# Patient Record
Sex: Male | Born: 1960 | Race: White | Hispanic: No | Smoking: Former smoker
Health system: Southern US, Community
[De-identification: ages and names within clinical notes are randomized; demographics above are authoritative.]

## PROBLEM LIST (undated history)

## (undated) DIAGNOSIS — F319 Bipolar disorder, unspecified: Secondary | ICD-10-CM

## (undated) HISTORY — PX: FEMUR SURGERY: SHX943

## (undated) HISTORY — DX: Bipolar disorder, unspecified: F31.9

## (undated) HISTORY — PX: TONSILLECTOMY: SUR1361

---

## 1988-02-03 DIAGNOSIS — F319 Bipolar disorder, unspecified: Secondary | ICD-10-CM

## 1988-02-03 HISTORY — DX: Bipolar disorder, unspecified: F31.9

## 2004-10-15 ENCOUNTER — Ambulatory Visit: Payer: Self-pay | Admitting: Internal Medicine

## 2004-10-30 ENCOUNTER — Ambulatory Visit: Payer: Self-pay | Admitting: Internal Medicine

## 2005-02-17 ENCOUNTER — Ambulatory Visit: Payer: Self-pay | Admitting: Internal Medicine

## 2005-04-22 ENCOUNTER — Ambulatory Visit: Payer: Self-pay | Admitting: Internal Medicine

## 2005-04-29 ENCOUNTER — Ambulatory Visit: Payer: Self-pay | Admitting: Internal Medicine

## 2005-05-13 ENCOUNTER — Emergency Department (HOSPITAL_COMMUNITY): Admission: EM | Admit: 2005-05-13 | Discharge: 2005-05-13 | Payer: Self-pay | Admitting: Emergency Medicine

## 2006-01-11 ENCOUNTER — Ambulatory Visit: Payer: Self-pay | Admitting: Internal Medicine

## 2006-03-22 ENCOUNTER — Ambulatory Visit: Payer: Self-pay | Admitting: Internal Medicine

## 2006-03-22 LAB — CONVERTED CEMR LAB
ALT: 24 units/L (ref 0–40)
AST: 24 units/L (ref 0–37)
Albumin: 3.6 g/dL (ref 3.5–5.2)
Alkaline Phosphatase: 60 units/L (ref 39–117)
BUN: 10 mg/dL (ref 6–23)
Basophils Absolute: 0.1 10*3/uL (ref 0.0–0.1)
Basophils Relative: 1.8 % — ABNORMAL HIGH (ref 0.0–1.0)
Bilirubin, Direct: 0.1 mg/dL (ref 0.0–0.3)
CO2: 29 meq/L (ref 19–32)
Calcium: 9 mg/dL (ref 8.4–10.5)
Chloride: 104 meq/L (ref 96–112)
Cholesterol: 188 mg/dL (ref 0–200)
Creatinine, Ser: 1 mg/dL (ref 0.4–1.5)
Eosinophils Absolute: 0.2 10*3/uL (ref 0.0–0.6)
Eosinophils Relative: 4.8 % (ref 0.0–5.0)
GFR calc Af Amer: 104 mL/min
GFR calc non Af Amer: 86 mL/min
Glucose, Bld: 86 mg/dL (ref 70–99)
HCT: 40.4 % (ref 39.0–52.0)
HDL: 41.9 mg/dL (ref >39.0)
Hemoglobin: 14.4 g/dL (ref 13.0–17.0)
LDL Cholesterol: 112 mg/dL — ABNORMAL HIGH (ref 0–99)
Lymphocytes Relative: 39.9 % (ref 12.0–46.0)
MCHC: 35.6 g/dL (ref 30.0–36.0)
MCV: 95.3 fL (ref 78.0–100.0)
Monocytes Absolute: 0.5 10*3/uL (ref 0.2–0.7)
Monocytes Relative: 11 % (ref 3.0–11.0)
Neutro Abs: 2 10*3/uL (ref 1.4–7.7)
Neutrophils Relative %: 42.5 % — ABNORMAL LOW (ref 43.0–77.0)
Platelets: 187 10*3/uL (ref 150–400)
Potassium: 3.8 meq/L (ref 3.5–5.1)
RBC: 4.24 M/uL (ref 4.22–5.81)
RDW: 12.1 % (ref 11.5–14.6)
Sodium: 141 meq/L (ref 135–145)
TSH: 4.62 microintl units/mL (ref 0.35–5.50)
Total Bilirubin: 0.9 mg/dL (ref 0.3–1.2)
Total CHOL/HDL Ratio: 4.5
Total Protein: 5.9 g/dL — ABNORMAL LOW (ref 6.0–8.3)
Triglycerides: 170 mg/dL — ABNORMAL HIGH (ref 0–149)
VLDL: 34 mg/dL (ref 0–40)
WBC: 4.6 10*3/uL (ref 4.5–10.5)

## 2006-03-29 ENCOUNTER — Ambulatory Visit: Payer: Self-pay | Admitting: Internal Medicine

## 2007-11-16 ENCOUNTER — Telehealth: Payer: Self-pay | Admitting: Internal Medicine

## 2007-12-21 ENCOUNTER — Ambulatory Visit: Payer: Self-pay | Admitting: Internal Medicine

## 2007-12-21 LAB — CONVERTED CEMR LAB
ALT: 26 units/L (ref 0–53)
Basophils Absolute: 0 10*3/uL (ref 0.0–0.1)
Bilirubin Urine: NEGATIVE
Bilirubin, Direct: 0.1 mg/dL (ref 0.0–0.3)
Blood in Urine, dipstick: NEGATIVE
CO2: 30 meq/L (ref 19–32)
Calcium: 9.2 mg/dL (ref 8.4–10.5)
Cholesterol: 175 mg/dL (ref 0–200)
Glucose, Urine, Semiquant: NEGATIVE
HDL: 36.3 mg/dL — ABNORMAL LOW (ref >39.0)
Lymphocytes Relative: 44.2 % (ref 12.0–46.0)
MCHC: 35.5 g/dL (ref 30.0–36.0)
Neutro Abs: 2.1 10*3/uL (ref 1.4–7.7)
Neutrophils Relative %: 39.9 % — ABNORMAL LOW (ref 43.0–77.0)
Protein, U semiquant: NEGATIVE
RDW: 12.1 % (ref 11.5–14.6)
Sodium: 140 meq/L (ref 135–145)
TSH: 4.55 microintl units/mL (ref 0.35–5.50)
Total Bilirubin: 0.7 mg/dL (ref 0.3–1.2)
Triglycerides: 222 mg/dL (ref 0–149)
VLDL: 44 mg/dL — ABNORMAL HIGH (ref 0–40)
pH: 7

## 2008-01-06 ENCOUNTER — Telehealth: Payer: Self-pay | Admitting: Internal Medicine

## 2010-09-10 ENCOUNTER — Emergency Department (HOSPITAL_COMMUNITY): Payer: Managed Care, Other (non HMO)

## 2010-09-10 ENCOUNTER — Inpatient Hospital Stay (HOSPITAL_COMMUNITY)
Admission: EM | Admit: 2010-09-10 | Discharge: 2010-09-12 | DRG: 603 | Disposition: A | Discharge status: Home / Self Care | Payer: Managed Care, Other (non HMO) | Attending: Orthopedic Surgery | Admitting: Orthopedic Surgery

## 2010-09-10 DIAGNOSIS — L03019 Cellulitis of unspecified finger: Secondary | ICD-10-CM | POA: Diagnosis present

## 2010-09-10 DIAGNOSIS — W268XXA Contact with other sharp object(s), not elsewhere classified, initial encounter: Secondary | ICD-10-CM | POA: Diagnosis present

## 2010-09-10 DIAGNOSIS — L03119 Cellulitis of unspecified part of limb: Principal | ICD-10-CM | POA: Diagnosis present

## 2010-09-10 DIAGNOSIS — F319 Bipolar disorder, unspecified: Secondary | ICD-10-CM | POA: Diagnosis present

## 2010-09-10 DIAGNOSIS — S61209A Unspecified open wound of unspecified finger without damage to nail, initial encounter: Secondary | ICD-10-CM | POA: Diagnosis present

## 2010-09-10 DIAGNOSIS — L02519 Cutaneous abscess of unspecified hand: Principal | ICD-10-CM | POA: Diagnosis present

## 2010-09-10 LAB — POCT I-STAT, CHEM 8
Chloride: 98 mEq/L (ref 96–112)
Glucose, Bld: 89 mg/dL (ref 70–99)
HCT: 42 % (ref 39.0–52.0)
Potassium: 4.1 mEq/L (ref 3.5–5.1)
Sodium: 136 mEq/L (ref 135–145)

## 2010-09-10 LAB — CBC
MCH: 32.2 pg (ref 26.0–34.0)
MCHC: 35.6 g/dL (ref 30.0–36.0)
MCV: 90.3 fL (ref 78.0–100.0)
Platelets: 161 10*3/uL (ref 150–400)
RBC: 4.32 MIL/uL (ref 4.22–5.81)

## 2010-09-10 LAB — DIFFERENTIAL
Lymphocytes Relative: 10 % — ABNORMAL LOW (ref 12–46)
Lymphs Abs: 1.7 10*3/uL (ref 0.7–4.0)
Neutro Abs: 13.6 10*3/uL — ABNORMAL HIGH (ref 1.7–7.7)
Neutrophils Relative %: 79 % — ABNORMAL HIGH (ref 43–77)

## 2010-09-11 LAB — BASIC METABOLIC PANEL
BUN: 8 mg/dL (ref 6–23)
Creatinine, Ser: 0.75 mg/dL (ref 0.50–1.35)
GFR calc Af Amer: >60 mL/min (ref >60)
GFR calc non Af Amer: >60 mL/min (ref >60)

## 2010-09-11 LAB — CBC
HCT: 33.7 % — ABNORMAL LOW (ref 39.0–52.0)
MCHC: 35.6 g/dL (ref 30.0–36.0)
MCV: 89.6 fL (ref 78.0–100.0)
RDW: 12.4 % (ref 11.5–15.5)

## 2010-09-12 LAB — CBC
HCT: 34.6 % — ABNORMAL LOW (ref 39.0–52.0)
Hemoglobin: 12.7 g/dL — ABNORMAL LOW (ref 13.0–17.0)
MCV: 89.4 fL (ref 78.0–100.0)
RBC: 3.87 MIL/uL — ABNORMAL LOW (ref 4.22–5.81)
WBC: 8.6 10*3/uL (ref 4.0–10.5)

## 2010-09-12 LAB — BASIC METABOLIC PANEL
BUN: 10 mg/dL (ref 6–23)
CO2: 27 mEq/L (ref 19–32)
Chloride: 103 mEq/L (ref 96–112)
Creatinine, Ser: 0.79 mg/dL (ref 0.50–1.35)
Glucose, Bld: 103 mg/dL — ABNORMAL HIGH (ref 70–99)

## 2010-09-17 LAB — CULTURE, BLOOD (ROUTINE X 2)
Culture  Setup Time: 201208090428
Culture: NO GROWTH

## 2010-09-19 NOTE — Discharge Summary (Signed)
  Brendan [name], Brendan [name] NO.:  [id number]  MEDICAL RECORD NO.:  [id number]  LOCATION:  1601                         FACILITY:  [facility]  PHYSICIAN:  Nadara Mustard, MD     DATE OF BIRTH:  1960-06-23  DATE OF ADMISSION:  09/10/2010 DATE OF DISCHARGE:  09/12/2010                              DISCHARGE SUMMARY   FINAL DIAGNOSIS:  Abscess and infection, right hand, and right long finger on the dorsum.  SURGICAL PROCEDURES:  Irrigation and debridement of right hand, right long finger.  IV ANTIBIOTICS:  Doxycycline.  Discharged to home in stable condition.  Continue wound care with soaks 20 minutes b.i.d.  Prescription for doxycycline 100 mg p.o. b.i.d. Percocet one to two p.o. q.4 h. p.r.n. for pain, mupirocin ointment dressing changes twice a day.  Follow up in the office next week.     Nadara Mustard, MD     MVD/MEDQ  D:  09/12/2010  T:  09/12/2010  Job:  [id number]  Electronically Signed by Aldean Baker MD on 09/19/2010 06:18:15 AM

## 2010-09-19 NOTE — Op Note (Signed)
  NAME:  Brendan [name], Brendan [name] NO.:  [id number]  MEDICAL RECORD NO.:  [id number]  LOCATION:  WLED                         FACILITY:  [facility]  PHYSICIAN:  Nadara Mustard, MD     DATE OF BIRTH:  05-24-1960  DATE OF PROCEDURE:  09/10/2010 DATE OF DISCHARGE:                              OPERATIVE REPORT   HISTORY OF PRESENT ILLNESS:  The patient is a 51 year old gentleman who states he was fishing on Monday and got a fish hook caught on the dorsum of the proximal phalanx of the right long finger.  The hook stayed in his finger for several hours while he continued to fish.  The patient then removed the hook and went to the medical doctor on Tuesday and he was started on Augmentin. The patient returned to the primary care physician today with red streaking going up his arm with pain, swelling and redness and drainage from the dorsum of the long finger.  The patient was then referred to the emergency room and presents at this time for initial evaluation.  PAST MEDICAL HISTORY:  Positive for history of bipolar disease.  He is on three medications for this.  No other allergies, no other medications.  Tetanus booster provided in the emergency room.  On physical examination he has swelling and redness over the dorsum of the long finger.  Dorsum of the hand with streaking going up the arm.  There is adenopathy.  His hand has pain with range of motion, but no tenderness to palpation over the flexor tendons.  Assessment is dorsal abscess of the right hand and right long finger.  PLAN:  After informed consent and sterile prepping, the right hand and long finger were locally anesthetized with 20 mL of 2% lidocaine plain. An extensile incision was made in a zigzag fashion over the dorsum of the hand from the PIP joint to the mid dorsum of the hand.  There was a small abscess, which was debrided.  The wound was irrigated with normal saline.  This was decompressed.  There was no  purulence extending upward down the extensor tendon.  There was no involvement of the flexor sheath.  The wound was irrigated with normal saline.  The wound was closed loosely with iodoform gauze packed proximally and distally.  The patient received doxycycline IV and vancomycin IV in the emergency room. He was then admitted and placed on IV doxycycline. We will follow this expectantly.  Discussed that the patient may be at risk for need for additional surgery.  Discussed we will follow this closely.  We will reevaluate in the morning after the wound was closed with 3-0 nylon loosely with a Iodoform packing, Adaptic, 4x4s, Kerlix and Coban.  The patient will be placed in a blue arm elevator and admitted to the orthopedic floor.     Nadara Mustard, MD     MVD/MEDQ  D:  09/11/2010  T:  09/11/2010  Job:  [id number]  Electronically Signed by Aldean Baker MD on 09/19/2010 06:18:44 AM

## 2013-04-08 ENCOUNTER — Emergency Department (HOSPITAL_COMMUNITY): Payer: BC Managed Care – PPO

## 2013-04-08 ENCOUNTER — Encounter (HOSPITAL_COMMUNITY): Payer: Self-pay | Admitting: Emergency Medicine

## 2013-04-08 ENCOUNTER — Emergency Department (HOSPITAL_COMMUNITY)
Admission: EM | Admit: 2013-04-08 | Discharge: 2013-04-08 | Disposition: A | Discharge status: Home / Self Care | Payer: BC Managed Care – PPO | Attending: Emergency Medicine | Admitting: Emergency Medicine

## 2013-04-08 DIAGNOSIS — Z8659 Personal history of other mental and behavioral disorders: Secondary | ICD-10-CM | POA: Insufficient documentation

## 2013-04-08 DIAGNOSIS — S91119A Laceration without foreign body of unspecified toe without damage to nail, initial encounter: Secondary | ICD-10-CM

## 2013-04-08 DIAGNOSIS — Y92009 Unspecified place in unspecified non-institutional (private) residence as the place of occurrence of the external cause: Secondary | ICD-10-CM | POA: Insufficient documentation

## 2013-04-08 DIAGNOSIS — S91109A Unspecified open wound of unspecified toe(s) without damage to nail, initial encounter: Secondary | ICD-10-CM | POA: Insufficient documentation

## 2013-04-08 DIAGNOSIS — W2209XA Striking against other stationary object, initial encounter: Secondary | ICD-10-CM | POA: Insufficient documentation

## 2013-04-08 DIAGNOSIS — Y9301 Activity, walking, marching and hiking: Secondary | ICD-10-CM | POA: Insufficient documentation

## 2013-04-08 HISTORY — DX: Bipolar disorder, unspecified: F31.9

## 2013-04-08 NOTE — ED Notes (Signed)
Pt ran foot over a piece of glass on the floor. Moderate bleeding. Cut noted to inner 3 toe digit on R foot.

## 2013-04-08 NOTE — ED Provider Notes (Signed)
Medical screening examination/treatment/procedure(s) were performed by non-physician practitioner and as supervising physician I was immediately available for consultation/collaboration.   EKG Interpretation None        Layla MawKristen N Jeiry Birnbaum, DO 04/08/13 2302

## 2013-04-08 NOTE — Discharge Instructions (Signed)
Laceration Care, Adult °A laceration is a cut or lesion that goes through all layers of the skin and into the tissue just beneath the skin. °TREATMENT  °Some lacerations may not require closure. Some lacerations may not be able to be closed due to an increased risk of infection. It is important to see your caregiver as soon as possible after an injury to minimize the risk of infection and maximize the opportunity for successful closure. °If closure is appropriate, pain medicines may be given, if needed. The wound will be cleaned to help prevent infection. Your caregiver will use stitches (sutures), staples, wound glue (adhesive), or skin adhesive strips to repair the laceration. These tools bring the skin edges together to allow for faster healing and a better cosmetic outcome. However, all wounds will heal with a scar. Once the wound has healed, scarring can be minimized by covering the wound with sunscreen during the day for 1 full year. °HOME CARE INSTRUCTIONS  °For sutures or staples: °· Keep the wound clean and dry. °· If you were given a bandage (dressing), you should change it at least once a day. Also, change the dressing if it becomes wet or dirty, or as directed by your caregiver. °· Wash the wound with soap and water 2 times a day. Rinse the wound off with water to remove all soap. Pat the wound dry with a clean towel. °· After cleaning, apply a thin layer of the antibiotic ointment as recommended by your caregiver. This will help prevent infection and keep the dressing from sticking. °· You may shower as usual after the first 24 hours. Do not soak the wound in water until the sutures are removed. °· Only take over-the-counter or prescription medicines for pain, discomfort, or fever as directed by your caregiver. °· Get your sutures or staples removed as directed by your caregiver. °For skin adhesive strips: °· Keep the wound clean and dry. °· Do not get the skin adhesive strips wet. You may bathe  carefully, using caution to keep the wound dry. °· If the wound gets wet, pat it dry with a clean towel. °· Skin adhesive strips will fall off on their own. You may trim the strips as the wound heals. Do not remove skin adhesive strips that are still stuck to the wound. They will fall off in time. °For wound adhesive: °· You may briefly wet your wound in the shower or bath. Do not soak or scrub the wound. Do not swim. Avoid periods of heavy perspiration until the skin adhesive has fallen off on its own. After showering or bathing, gently pat the wound dry with a clean towel. °· Do not apply liquid medicine, cream medicine, or ointment medicine to your wound while the skin adhesive is in place. This may loosen the film before your wound is healed. °· If a dressing is placed over the wound, be careful not to apply tape directly over the skin adhesive. This may cause the adhesive to be pulled off before the wound is healed. °· Avoid prolonged exposure to sunlight or tanning lamps while the skin adhesive is in place. Exposure to ultraviolet light in the first year will darken the scar. °· The skin adhesive will usually remain in place for 5 to 10 days, then naturally fall off the skin. Do not pick at the adhesive film. °You may need a tetanus shot if: °· You cannot remember when you had your last tetanus shot. °· You have never had a tetanus   shot. °If you get a tetanus shot, your arm may swell, get red, and feel warm to the touch. This is common and not a problem. If you need a tetanus shot and you choose not to have one, there is a rare chance of getting tetanus. Sickness from tetanus can be serious. °SEEK MEDICAL CARE IF:  °· You have redness, swelling, or increasing pain in the wound. °· You see a red line that goes away from the wound. °· You have yellowish-white fluid (pus) coming from the wound. °· You have a fever. °· You notice a bad smell coming from the wound or dressing. °· Your wound breaks open before or  after sutures have been removed. °· You notice something coming out of the wound such as wood or glass. °· Your wound is on your hand or foot and you cannot move a finger or toe. °SEEK IMMEDIATE MEDICAL CARE IF:  °· Your pain is not controlled with prescribed medicine. °· You have severe swelling around the wound causing pain and numbness or a change in color in your arm, hand, leg, or foot. °· Your wound splits open and starts bleeding. °· You have worsening numbness, weakness, or loss of function of any joint around or beyond the wound. °· You develop painful lumps near the wound or on the skin anywhere on your body. °MAKE SURE YOU:  °· Understand these instructions. °· Will watch your condition. °· Will get help right away if you are not doing well or get worse. °Document Released: 01/19/2005 Document Revised: 04/13/2011 Document Reviewed: 07/15/2010 °ExitCare® Patient Information ©2014 [employer]. ° °Sutured Wound Care °Sutures are stitches that can be used to close wounds. Wound care helps prevent pain and infection.  °HOME CARE INSTRUCTIONS  °· Rest and elevate the injured area until all the pain and swelling are gone. °· Only take over-the-counter or prescription medicines for pain, discomfort, or fever as directed by your caregiver. °· After 48 hours, gently wash the area with mild soap and water once a day, or as directed. Rinse off the soap. Pat the area dry with a clean towel. Do not rub the wound. This may cause bleeding. °· Follow your caregiver's instructions for how often to change the bandage (dressing). Stop using a dressing after 2 days or after the wound stops draining. °· If the dressing sticks, moisten it with soapy water and gently remove it. °· Apply ointment on the wound as directed. °· Avoid stretching a sutured wound. °· Drink enough fluids to keep your urine clear or pale yellow. °· Follow up with your caregiver for suture removal as directed. °· Use sunscreen on your wound for the next  3 to 6 months so the scar will not darken. °SEEK IMMEDIATE MEDICAL CARE IF:  °· Your wound becomes red, swollen, hot, or tender. °· You have increasing pain in the wound. °· You have a red streak that extends from the wound. °· There is pus coming from the wound. °· You have a fever. °· You have shaking chills. °· There is a bad smell coming from the wound. °· You have persistent bleeding from the wound. °MAKE SURE YOU:  °· Understand these instructions. °· Will watch your condition. °· Will get help right away if you are not doing well or get worse. °Document Released: 02/27/2004 Document Revised: 04/13/2011 Document Reviewed: 05/25/2010 °ExitCare® Patient Information ©2014 [employer]. ° °

## 2013-04-08 NOTE — ED Provider Notes (Signed)
CSN: [id number]     Arrival date & time 04/08/13  2019 History  This chart was scribed for non-physician practitioner working with Layla Maw Ward, DO by Ashley Jacobs, ED scribe. This patient was seen in room WTR9/WTR9 and the patient's care was started at 9:00 PM.   First MD Initiated Contact with Patient 04/08/13 2043     Chief Complaint  Patient presents with  . Foot Injury     (Consider location/radiation/quality/duration/timing/severity/associated sxs/prior Treatment) The history is provided by the patient and medical records. No language interpreter was used.   HPI Comments: Hobson Lax is a 53 y.o. male who presents to the Emergency Department complaining of a laceration to his medial third toe of his right foot today while at home today. While walking in the dark hallway pt accidentally kicked a slab glass ( from a fish tank project ) that was lying on the floor. Upon arrival pt had a significant amount of blood in his shoe and he is moderately bleeding. His tetanus shot is UTD. Pt reports his right second toe nail was surgically removed for plantar warts.  Past Medical History  Diagnosis Date  . Bipolar 1 disorder    No past surgical history on file. No family history on file. History  Substance Use Topics  . Smoking status: Never Smoker   . Smokeless tobacco: Not on file  . Alcohol Use: No    Review of Systems  Skin: Positive for wound (third toe of the right foot).      Allergies  Review of patient's allergies indicates not on file.  Home Medications  No current outpatient prescriptions on file. BP 121/85  Pulse 89  Temp(Src) 98.3 F (36.8 C) (Oral)  Resp 20  Ht 5\' 11"  (1.803 m)  Wt 190 lb (86.183 kg)  BMI 26.51 kg/m2  SpO2 97% Physical Exam  Nursing note and vitals reviewed. Constitutional: He is oriented to person, place, and time. He appears well-developed and well-nourished. No distress.  HENT:  Head: Normocephalic and atraumatic.  Mouth/Throat:  Oropharynx is clear and moist.  Eyes: Conjunctivae are normal. No scleral icterus.  Pulmonary/Chest: Effort normal.  Musculoskeletal: Normal range of motion. He exhibits no edema and no tenderness.  Neurological: He is alert and oriented to person, place, and time. He exhibits normal muscle tone. Coordination normal.  Skin: Skin is warm and dry. No rash noted. No erythema. No pallor.  1 cm laceration noted to right 3rd toe to plantar surface and 0.5 cm laceration to the dorsal surface of the 2nd toe.  Psychiatric: He has a normal mood and affect. His behavior is normal. Judgment and thought content normal.    ED Course  Procedures (including critical care time) DIAGNOSTIC STUDIES: Oxygen Saturation is 97% on room air, normal by my interpretation.    COORDINATION OF CARE:  9:08 PM Discussed course of care with pt which includes suture repair. Pt understands and agrees.   Labs Review Labs Reviewed - No data to display Imaging Review No results found.   EKG Interpretation None     LACERATION REPAIR Performed by: Cherrie Distance C. Authorized by: Patrecia Pour Consent: Verbal consent obtained. Risks and benefits: risks, benefits and alternatives were discussed Consent given by: patient Patient identity confirmed: provided demographic data Prepped and Draped in normal sterile fashion Wound explored  Laceration Location: right 3rd toe  Laceration Length: 1cm  No Foreign Bodies seen or palpated  Anesthesia: digital block  Local anesthetic: lidocaine 1% without epinephrine  Anesthetic  total: 3 ml  Irrigation method: syringe Amount of cleaning: standard  Skin closure: 4.0 nylon  Number of sutures: 5  Technique: simple interrupted  Patient tolerance: Patient tolerated the procedure well with no immediate complications.  LACERATION REPAIR Performed by: Patrecia PourSANFORD,Kamalani Mastro C. Authorized by: Patrecia PourSANFORD,Bora Bost C. Consent: Verbal consent obtained. Risks and benefits:  risks, benefits and alternatives were discussed Consent given by: patient Patient identity confirmed: provided demographic data Prepped and Draped in normal sterile fashion Wound explored  Laceration Location: right 2nd toe  Laceration Length: 0.5cm  No Foreign Bodies seen or palpated  Anesthesia: local infiltration  Local anesthetic: lidocaine 1% without epinephrine  Anesthetic total: 1 ml  Irrigation method: syringe Amount of cleaning: standard  Skin closure: 4.0 prolene  Number of sutures: 2  Technique: simple interrupted  Patient tolerance: Patient tolerated the procedure well with no immediate complications.  MDM   Toe lacerations  Patient here with laceration to right 2nd and 3rd toes, hemostatic, FROM, sensation intact distally.  I personally performed the services described in this documentation, which was scribed in my presence. The recorded information has been reviewed and is accurate.    Izola PriceFrances C. Marisue HumbleSanford, PA-C 04/08/13 2300

## 2013-04-27 ENCOUNTER — Ambulatory Visit (INDEPENDENT_AMBULATORY_CARE_PROVIDER_SITE_OTHER): Payer: BC Managed Care – PPO | Admitting: Family

## 2013-04-27 ENCOUNTER — Encounter: Payer: Self-pay | Admitting: Family

## 2013-04-27 VITALS — BP 100/60 | HR 80 | Ht 70.75 in | Wt 199.0 lb

## 2013-04-27 DIAGNOSIS — Z125 Encounter for screening for malignant neoplasm of prostate: Secondary | ICD-10-CM

## 2013-04-27 DIAGNOSIS — Z113 Encounter for screening for infections with a predominantly sexual mode of transmission: Secondary | ICD-10-CM

## 2013-04-27 DIAGNOSIS — Z0000 Encounter for general adult medical examination without abnormal findings: Secondary | ICD-10-CM

## 2013-04-27 DIAGNOSIS — F319 Bipolar disorder, unspecified: Secondary | ICD-10-CM

## 2013-04-27 NOTE — Progress Notes (Signed)
Pre visit review using our clinic review tool, if applicable. No additional management support is needed unless otherwise documented below in the visit note. 

## 2013-04-27 NOTE — Progress Notes (Signed)
Subjective:    Patient ID: Brendan [name], male    DOB: Jul 13, 1960, 53 y.o.   MRN: [id number]  HPI 53 year old WM, nonsmoker with a history of Bipolar Disorder, patient presents for yearly preventative medicine examination. Medicare questionnaire was completed  All immunizations and health maintenance protocols were reviewed with the patient and needed orders were placed.  Appropriate screening laboratory values were ordered for the patient including screening of hyperlipidemia, renal function and hepatic function. If indicated by BPH, a PSA was ordered.  Medication reconciliation,  past medical history, social history, problem list and allergies were reviewed in detail with the patient  Goals were established with regard to weight loss, exercise, and  diet in compliance with medications  End of life planning was discussed.    Review of Systems  Constitutional: Negative.   HENT: Negative.   Eyes: Negative.   Respiratory: Negative.   Cardiovascular: Negative.   Gastrointestinal: Negative.   Endocrine: Negative.   Genitourinary: Negative.        Erectile dysfunction  Musculoskeletal: Negative.   Skin: Negative.   Allergic/Immunologic: Negative.   Neurological: Negative.   Hematological: Negative.   Psychiatric/Behavioral: Negative.    Past Medical History  Diagnosis Date  . Bipolar 1 disorder   . Bipolar disorder     History   Social History  . Marital Status: Divorced    Spouse Name: N/A    Number of Children: N/A  . Years of Education: N/A   Occupational History  . Not on file.   Social History Main Topics  . Smoking status: Never Smoker   . Smokeless tobacco: Not on file  . Alcohol Use: No  . Drug Use: No  . Sexual Activity: Not on file   Other Topics Concern  . Not on file   Social History Narrative  . No narrative on file    Past Surgical History  Procedure Laterality Date  . Femur surgery    . Tonsillectomy      No family history on  file.  No Known Allergies  No current outpatient prescriptions on file prior to visit.   No current facility-administered medications on file prior to visit.    BP 100/60  Pulse 80  Ht 5' 10.75" (1.797 m)  Wt 199 lb (90.266 kg)  BMI 27.95 kg/m2  SpO2 98%chart    Objective:   Physical Exam  Constitutional: He is oriented to person, place, and time. He appears well-developed and well-nourished.  HENT:  Head: Normocephalic and atraumatic.  Right Ear: External ear normal.  Left Ear: External ear normal.  Nose: Nose normal.  Mouth/Throat: Oropharynx is clear and moist.  Eyes: Conjunctivae and EOM are normal. Pupils are equal, round, and reactive to light.  Neck: Normal range of motion. Neck supple. No thyromegaly present.  Cardiovascular: Normal rate, regular rhythm and normal heart sounds.   Pulmonary/Chest: Effort normal and breath sounds normal.  Abdominal: Soft. Bowel sounds are normal.  Genitourinary: Rectum normal, prostate normal and penis normal. Guaiac negative stool. No penile tenderness.  Musculoskeletal: Normal range of motion.  Neurological: He is alert and oriented to person, place, and time. He has normal reflexes. He displays normal reflexes. No cranial nerve deficit. Coordination normal.  Skin: Skin is warm and dry.  Psychiatric: He has a normal mood and affect.          Assessment & Plan:  Adi was seen today for establish care, medication refill and laceration.  Diagnoses and associated orders  for this visit:  Preventative health care - EKG 12-Lead - CMP - Lipid Panel - CBC with Differential - POCT urinalysis dipstick - PSA  Bipolar disorder, unspecified - EKG 12-Lead - CMP - Valproic Acid Level - CBC with Differential  Screening for prostate cancer - PSA   Call the office with any questions or concerns. Advised needs colonoscopy screening ASAP. Will let me know when we can schedule due to transportation issues.

## 2013-04-27 NOTE — Patient Instructions (Signed)
Erectile Dysfunction  Erectile dysfunction is the inability to get or sustain a good enough erection to have sexual intercourse. Erectile dysfunction may involve:   Inability to get an erection.   Lack of enough hardness to allow penetration.   Loss of the erection before sex is finished.   Premature ejaculation.  CAUSES   Certain drugs, such as:   Pain relievers.   Antihistamines.   Antidepressants.   Blood pressure medicines.   Water pills (diuretics).   Ulcer medicines.   Muscle relaxants.   Illegal drugs.   Excessive drinking.   Psychological causes, such as:   Anxiety.   Depression.   Sadness.   Exhaustion.   Performance fear.   Stress.   Physical causes, such as:   Artery problems. This may include diabetes, smoking, liver disease, or atherosclerosis.   High blood pressure.   Hormonal problems, such as low testosterone.   Obesity.   Nerve problems. This may include back or pelvic injuries, diabetes mellitus, multiple sclerosis, or Parkinson disease.  SYMPTOMS   Inability to get an erection.   Lack of enough hardness to allow penetration.   Loss of the erection before sex is finished.   Premature ejaculation.   Normal erections at some times, but with frequent unsatisfactory episodes.   Orgasms that are not satisfactory in sensation or frequency.   Low sexual satisfaction in either partner because of erection problems.   A curved penis occurring with erection. The curve may cause pain or may be too curved to allow for intercourse.   Never having nighttime erections.  DIAGNOSIS  Your caregiver can often diagnose this condition by:   Performing a physical exam to find other diseases or specific problems with the penis.   Asking you detailed questions about the problem.   Performing blood tests to check for diabetes mellitus or to measure hormone levels.   Performing urine tests to find other underlying health conditions.   Performing an ultrasound exam to check for  scarring.   Performing a test to check blood flow to the penis.   Doing a sleep study at home to measure nighttime erections.  TREATMENT    You may be prescribed medicines by mouth.   You may be given medicine injections into the penis.   You may be prescribed a vacuum pump with a ring.   Penile implant surgery may be performed. You may receive:   An inflatable implant.   A semirigid implant.   Blood vessel surgery may be performed.  HOME CARE INSTRUCTIONS   If you are prescribed oral medicine, you should take the medicine as prescribed. Do not increase the dosage without first discussing it with your physician.   If you are using self-injections, be careful to avoid any veins that are on the surface of the penis. Apply pressure to the injection site for 5 minutes.   If you are using a vacuum pump, make sure you have read the instructions before using it. Discuss any questions with your physician before taking the pump home.  SEEK MEDICAL CARE IF:   You experience pain that is not responsive to the pain medicine you have been prescribed.   You experience nausea or vomiting.  SEEK IMMEDIATE MEDICAL CARE IF:    When taking oral or injectable medications, you experience an erection that lasts longer than 4 hours. If your physician is unavailable, go to the nearest emergency room for evaluation. An erection that lasts much longer than 4 hours can   result in permanent damage to your penis.   You have pain that is severe.   You develop redness, severe pain, or severe swelling of your penis.   You have redness spreading up into your groin or lower abdomen.   You are unable to pass your urine.  Document Released: 01/17/2000 Document Revised: 09/21/2012 Document Reviewed: 06/23/2012  [employer] Patient Information 2014 [employer].

## 2013-04-28 ENCOUNTER — Other Ambulatory Visit: Payer: BC Managed Care – PPO

## 2013-04-28 LAB — COMPREHENSIVE METABOLIC PANEL
ALT: 33 U/L (ref 0–53)
AST: 31 U/L (ref 0–37)
Albumin: 4.3 g/dL (ref 3.5–5.2)
Alkaline Phosphatase: 60 U/L (ref 39–117)
BILIRUBIN TOTAL: 0.7 mg/dL (ref 0.3–1.2)
BUN: 10 mg/dL (ref 6–23)
CALCIUM: 9.5 mg/dL (ref 8.4–10.5)
CHLORIDE: 97 meq/L (ref 96–112)
CO2: 30 meq/L (ref 19–32)
CREATININE: 1.1 mg/dL (ref 0.4–1.5)
GFR: 75.2 mL/min (ref >60.00)
Glucose, Bld: 89 mg/dL (ref 70–99)
Potassium: 4.3 mEq/L (ref 3.5–5.1)
SODIUM: 135 meq/L (ref 135–145)
TOTAL PROTEIN: 6.3 g/dL (ref 6.0–8.3)

## 2013-04-28 LAB — CBC WITH DIFFERENTIAL/PLATELET
BASOS ABS: 0 10*3/uL (ref 0.0–0.1)
Basophils Relative: 0.4 % (ref 0.0–3.0)
EOS PCT: 4.5 % (ref 0.0–5.0)
Eosinophils Absolute: 0.2 10*3/uL (ref 0.0–0.7)
HCT: 42.7 % (ref 39.0–52.0)
Hemoglobin: 14.8 g/dL (ref 13.0–17.0)
LYMPHS PCT: 19.5 % (ref 12.0–46.0)
Lymphs Abs: 1 10*3/uL (ref 0.7–4.0)
MCHC: 34.6 g/dL (ref 30.0–36.0)
MCV: 95.6 fl (ref 78.0–100.0)
MONOS PCT: 13.1 % — AB (ref 3.0–12.0)
Monocytes Absolute: 0.7 10*3/uL (ref 0.1–1.0)
NEUTROS PCT: 62.5 % (ref 43.0–77.0)
Neutro Abs: 3.2 10*3/uL (ref 1.4–7.7)
PLATELETS: 197 10*3/uL (ref 150.0–400.0)
RBC: 4.46 Mil/uL (ref 4.22–5.81)
RDW: 13 % (ref 11.5–14.6)
WBC: 5.1 10*3/uL (ref 4.5–10.5)

## 2013-04-28 LAB — POCT URINALYSIS DIPSTICK
BILIRUBIN UA: NEGATIVE
Blood, UA: 7.5
GLUCOSE UA: NEGATIVE
KETONES UA: NEGATIVE
LEUKOCYTES UA: NEGATIVE
NITRITE UA: NEGATIVE
PH UA: 7.5
Protein, UA: NEGATIVE
Spec Grav, UA: 1.015
Urobilinogen, UA: 0.2

## 2013-04-28 LAB — LIPID PANEL
CHOL/HDL RATIO: 3
Cholesterol: 175 mg/dL (ref 0–200)
HDL: 50.5 mg/dL (ref >39.00)
LDL CALC: 109 mg/dL — AB (ref 0–99)
TRIGLYCERIDES: 77 mg/dL (ref 0.0–149.0)
VLDL: 15.4 mg/dL (ref 0.0–40.0)

## 2013-04-28 LAB — PSA: PSA: 1.22 ng/mL (ref 0.10–4.00)

## 2013-04-28 NOTE — Addendum Note (Signed)
Addended by: Bonnye FavaKWEI, Damione Robideau K on: 04/28/2013 11:04 AM   Modules accepted: Orders

## 2013-04-29 LAB — VALPROIC ACID LEVEL: Valproic Acid Lvl: 55.4 ug/mL (ref 50.0–100.0)

## 2013-04-29 LAB — HEPATITIS C ANTIBODY: HCV AB: NEGATIVE

## 2013-04-29 LAB — HIV ANTIBODY (ROUTINE TESTING W REFLEX): HIV: NONREACTIVE

## 2014-02-09 ENCOUNTER — Telehealth: Payer: Self-pay | Admitting: Family

## 2014-02-09 NOTE — Telephone Encounter (Signed)
Pt is calling to sch a cpx and NP is leaving. Please advise

## 2014-02-16 NOTE — Telephone Encounter (Signed)
lmom for pt to cb

## 2014-02-16 NOTE — Telephone Encounter (Signed)
Ok to advise pt that Brendan [name] will no longer be here full time effective 03/09/14.  A letter will be mailed to all of her patients advising them of this and what to do regarding establishing with a new PCP.

## 2014-10-05 ENCOUNTER — Encounter (HOSPITAL_COMMUNITY): Payer: Self-pay | Admitting: Emergency Medicine

## 2014-10-05 ENCOUNTER — Ambulatory Visit (INDEPENDENT_AMBULATORY_CARE_PROVIDER_SITE_OTHER): Payer: BLUE CROSS/BLUE SHIELD | Admitting: Family Medicine

## 2014-10-05 ENCOUNTER — Emergency Department (HOSPITAL_COMMUNITY)
Admission: EM | Admit: 2014-10-05 | Discharge: 2014-10-05 | Disposition: A | Discharge status: Home / Self Care | Payer: BLUE CROSS/BLUE SHIELD | Attending: Emergency Medicine | Admitting: Emergency Medicine

## 2014-10-05 DIAGNOSIS — R109 Unspecified abdominal pain: Secondary | ICD-10-CM | POA: Diagnosis present

## 2014-10-05 DIAGNOSIS — R1013 Epigastric pain: Secondary | ICD-10-CM | POA: Insufficient documentation

## 2014-10-05 DIAGNOSIS — Z79899 Other long term (current) drug therapy: Secondary | ICD-10-CM | POA: Insufficient documentation

## 2014-10-05 DIAGNOSIS — R69 Illness, unspecified: Secondary | ICD-10-CM

## 2014-10-05 DIAGNOSIS — F319 Bipolar disorder, unspecified: Secondary | ICD-10-CM | POA: Diagnosis not present

## 2014-10-05 LAB — CBC WITH DIFFERENTIAL/PLATELET
Basophils Absolute: 0 10*3/uL (ref 0.0–0.1)
Basophils Relative: 0 % (ref 0–1)
EOS ABS: 0.1 10*3/uL (ref 0.0–0.7)
Eosinophils Relative: 1 % (ref 0–5)
HEMATOCRIT: 43.1 % (ref 39.0–52.0)
HEMOGLOBIN: 15.4 g/dL (ref 13.0–17.0)
LYMPHS ABS: 1 10*3/uL (ref 0.7–4.0)
Lymphocytes Relative: 9 % — ABNORMAL LOW (ref 12–46)
MCH: 33 pg (ref 26.0–34.0)
MCHC: 35.7 g/dL (ref 30.0–36.0)
MCV: 92.5 fL (ref 78.0–100.0)
MONO ABS: 1.1 10*3/uL — AB (ref 0.1–1.0)
MONOS PCT: 10 % (ref 3–12)
NEUTROS PCT: 80 % — AB (ref 43–77)
Neutro Abs: 8.7 10*3/uL — ABNORMAL HIGH (ref 1.7–7.7)
Platelets: 202 10*3/uL (ref 150–400)
RBC: 4.66 MIL/uL (ref 4.22–5.81)
RDW: 12.5 % (ref 11.5–15.5)
WBC: 11.1 10*3/uL — ABNORMAL HIGH (ref 4.0–10.5)

## 2014-10-05 LAB — COMPREHENSIVE METABOLIC PANEL
ALK PHOS: 66 U/L (ref 38–126)
ALT: 23 U/L (ref 17–63)
ANION GAP: 4 — AB (ref 5–15)
AST: 29 U/L (ref 15–41)
Albumin: 4.3 g/dL (ref 3.5–5.0)
BILIRUBIN TOTAL: 0.6 mg/dL (ref 0.3–1.2)
BUN: 12 mg/dL (ref 6–20)
CALCIUM: 9.8 mg/dL (ref 8.9–10.3)
CO2: 34 mmol/L — ABNORMAL HIGH (ref 22–32)
Chloride: 102 mmol/L (ref 101–111)
Creatinine, Ser: 0.96 mg/dL (ref 0.61–1.24)
GFR calc non Af Amer: >60 mL/min (ref >60)
Glucose, Bld: 105 mg/dL — ABNORMAL HIGH (ref 65–99)
Potassium: 3.9 mmol/L (ref 3.5–5.1)
Sodium: 140 mmol/L (ref 135–145)
TOTAL PROTEIN: 6.9 g/dL (ref 6.5–8.1)

## 2014-10-05 LAB — LIPASE, BLOOD: LIPASE: 37 U/L (ref 22–51)

## 2014-10-05 MED ORDER — FAMOTIDINE 20 MG PO TABS
20.0000 mg | ORAL_TABLET | Freq: Once | ORAL | Status: AC
  Administered 2014-10-05: 20 mg via ORAL
  Filled 2014-10-05: qty 1

## 2014-10-05 MED ORDER — ALUM & MAG HYDROXIDE-SIMETH 200-200-20 MG/5ML PO SUSP
15.0000 mL | Freq: Once | ORAL | Status: AC
  Administered 2014-10-05: 15 mL via ORAL
  Filled 2014-10-05: qty 30

## 2014-10-05 MED ORDER — LIDOCAINE VISCOUS 2 % MT SOLN
15.0000 mL | Freq: Once | OROMUCOSAL | Status: AC
  Administered 2014-10-05: 15 mL via OROMUCOSAL
  Filled 2014-10-05: qty 15

## 2014-10-05 NOTE — ED Notes (Signed)
Pt reports abdominal pain that started last night and has persisted through the morning. Pt reports BM last night at 9pm with normal consistency. Denies n/v. Denis radiating pain.

## 2014-10-05 NOTE — ED Notes (Signed)
PO challenge completed; fluids tolerated well. Dr. Edward Jolly notified.

## 2014-10-05 NOTE — ED Notes (Signed)
Called lab to inquire about results, per Joni Reining there was a delay due to order being put in late? but, they will work on it now.

## 2014-10-05 NOTE — ED Provider Notes (Signed)
CSN: [id number]     Arrival date & time 10/05/14  0848 History   First MD Initiated Contact with Patient 10/05/14 [phone]     Chief Complaint  Patient presents with  . Abdominal Pain     (Consider location/radiation/quality/duration/timing/severity/associated sxs/prior Treatment) Patient is a 54 y.o. male presenting with abdominal pain. The history is provided by the patient.  Abdominal Pain Pain location:  Epigastric Pain quality: burning and sharp   Pain radiates to:  Does not radiate Pain severity:  Moderate Onset quality:  Sudden Duration:  6 hours Timing:  Constant Progression:  Unchanged Chronicity:  New Relieved by:  Nothing Worsened by:  Nothing tried Ineffective treatments:  None tried Associated symptoms: no chest pain, no chills, no diarrhea, no fever, no shortness of breath and no vomiting    54 yo M with a chief complaint of epigastric abdominal pain. This started about 4 in the morning. Patient states that he was still awake from working his second shift had just eaten a big meal and then laid down flat. Patient states that positionally his pain got mildly better while lying on his right side. Patient otherwise stated that his pain is continually gotten worse. Denies vomiting denies diarrhea denies nausea. Patient tried taking Tums without relief. Patient then decided to come to the emergency department. Denies radiation of pain.  Past Medical History  Diagnosis Date  . Bipolar 1 disorder   . Bipolar disorder    Past Surgical History  Procedure Laterality Date  . Femur surgery    . Tonsillectomy     No family history on file. Social History  Substance Use Topics  . Smoking status: Never Smoker   . Smokeless tobacco: None  . Alcohol Use: No    Review of Systems  Constitutional: Negative for fever and chills.  HENT: Negative for congestion and facial swelling.   Eyes: Negative for discharge and visual disturbance.  Respiratory: Negative for shortness of  breath.   Cardiovascular: Negative for chest pain and palpitations.  Gastrointestinal: Positive for abdominal pain. Negative for vomiting and diarrhea.  Musculoskeletal: Negative for myalgias and arthralgias.  Skin: Negative for color change and rash.  Neurological: Negative for tremors, syncope and headaches.  Psychiatric/Behavioral: Negative for confusion and dysphoric mood.      Allergies  Review of patient's allergies indicates no known allergies.  Home Medications   Prior to Admission medications   Medication Sig Start Date End Date Taking? Authorizing Provider  divalproex (DEPAKOTE ER) 500 MG 24 hr tablet Take 2 tablets by mouth every morning. 09/21/14  Yes Historical Provider, MD  fluticasone (FLONASE) 50 MCG/ACT nasal spray Place 2 sprays into both nostrils daily as needed.  09/15/14  Yes Historical Provider, MD  olopatadine (PATANOL) 0.1 % ophthalmic solution INSTILL 1 DROP INTO BOTH EYES TWICE DAILY AS NEEDED AT 6-8 HOUR INTERVALS. 09/24/14  Yes Historical Provider, MD  QUEtiapine (SEROQUEL) 400 MG tablet Take 400 mg by mouth at bedtime.   Yes Historical Provider, MD  venlafaxine XR (EFFEXOR-XR) 150 MG 24 hr capsule Take 300 mg by mouth daily with breakfast.   Yes Historical Provider, MD   BP 136/88 mmHg  Pulse 70  Temp(Src) 97.5 F (36.4 C) (Oral)  Resp 20  SpO2 97% Physical Exam  Constitutional: He is oriented to person, place, and time. He appears well-developed and well-nourished.  HENT:  Head: Normocephalic and atraumatic.  Eyes: EOM are normal. Pupils are equal, round, and reactive to light.  Neck: Normal range of  motion. Neck supple. No JVD present.  Cardiovascular: Normal rate and regular rhythm.  Exam reveals no gallop and no friction rub.   No murmur heard. Pulmonary/Chest: No respiratory distress. He has no wheezes.  Abdominal: He exhibits no distension. There is tenderness (mild epigastric). There is no rebound and no guarding.  Musculoskeletal: Normal range  of motion.  Neurological: He is alert and oriented to person, place, and time.  Skin: No rash noted. No pallor.  Psychiatric: He has a normal mood and affect. His behavior is normal.    ED Course  Procedures (including critical care time) Labs Review Labs Reviewed  CBC WITH DIFFERENTIAL/PLATELET - Abnormal; Notable for the following:    WBC 11.1 (*)    Neutrophils Relative % 80 (*)    Neutro Abs 8.7 (*)    Lymphocytes Relative 9 (*)    Monocytes Absolute 1.1 (*)    All other components within normal limits  COMPREHENSIVE METABOLIC PANEL - Abnormal; Notable for the following:    CO2 34 (*)    Glucose, Bld 105 (*)    Anion gap 4 (*)    All other components within normal limits  LIPASE, BLOOD    Imaging Review No results found. I have personally reviewed and evaluated these images and lab results as part of my medical decision-making.   EKG Interpretation   Date/Time:  Friday October 05 2014 11:02:34 EDT Ventricular Rate:  63 PR Interval:  162 QRS Duration: 108 QT Interval:  389 QTC Calculation: 398 R Axis:   80 Text Interpretation:  Sinus rhythm No significant change since last  tracing Confirmed by Muneeb Veras MD, Reuel Boom ([id number]) on 10/05/2014 11:17:53 AM      MDM   Final diagnoses:  Epigastric pain    54 yo M with a chief complaint of epigastric abdominal pain. Benign abdominal exam. History consistent with GERD. Pain improved with GI cocktail. See no reason for imaging at this time.  Laboratory evaluation unremarkable. Reassessed with improvement.   1:56 PM:  I have discussed the diagnosis/risks/treatment options with the patient and believe the pt to be eligible for discharge home to follow-up with PCP. We also discussed returning to the ED immediately if new or worsening sx occur. We discussed the sx which are most concerning (e.g., sudden worsening pain) that necessitate immediate return. Medications administered to the patient during their visit and any new  prescriptions provided to the patient are listed below.  Medications given during this visit Medications  alum & mag hydroxide-simeth (MAALOX/MYLANTA) 200-200-20 MG/5ML suspension 15 mL (15 mLs Oral Given 10/05/14 0947)  lidocaine (XYLOCAINE) 2 % viscous mouth solution 15 mL (15 mLs Mouth/Throat Given 10/05/14 0947)  famotidine (PEPCID) tablet 20 mg (20 mg Oral Given 10/05/14 1103)    New Prescriptions   No medications on file     The patient appears reasonably screen and/or stabilized for discharge and I doubt any other medical condition or other [facility] requiring further screening, evaluation, or treatment in the ED at this time prior to discharge.    Melene Plan, DO 10/05/14 1357

## 2014-10-05 NOTE — Progress Notes (Signed)
No show/late cancel  

## 2014-10-05 NOTE — Discharge Instructions (Signed)

## 2014-10-05 NOTE — ED Notes (Signed)
Discharge information given, no further questions or needs. Patient departed ambulatory with no apparent distress.

## 2014-11-23 ENCOUNTER — Encounter (HOSPITAL_COMMUNITY): Payer: Self-pay | Admitting: Emergency Medicine

## 2014-11-23 ENCOUNTER — Emergency Department (HOSPITAL_COMMUNITY): Payer: BLUE CROSS/BLUE SHIELD

## 2014-11-23 ENCOUNTER — Observation Stay (HOSPITAL_COMMUNITY)
Admission: EM | Admit: 2014-11-23 | Discharge: 2014-11-26 | Disposition: A | Discharge status: Home / Self Care | Payer: BLUE CROSS/BLUE SHIELD | Attending: Internal Medicine | Admitting: Internal Medicine

## 2014-11-23 DIAGNOSIS — K819 Cholecystitis, unspecified: Secondary | ICD-10-CM

## 2014-11-23 DIAGNOSIS — Z79899 Other long term (current) drug therapy: Secondary | ICD-10-CM | POA: Insufficient documentation

## 2014-11-23 DIAGNOSIS — F319 Bipolar disorder, unspecified: Secondary | ICD-10-CM | POA: Diagnosis present

## 2014-11-23 DIAGNOSIS — Z7951 Long term (current) use of inhaled steroids: Secondary | ICD-10-CM | POA: Diagnosis not present

## 2014-11-23 DIAGNOSIS — R52 Pain, unspecified: Secondary | ICD-10-CM

## 2014-11-23 DIAGNOSIS — K219 Gastro-esophageal reflux disease without esophagitis: Secondary | ICD-10-CM | POA: Diagnosis not present

## 2014-11-23 DIAGNOSIS — K8000 Calculus of gallbladder with acute cholecystitis without obstruction: Principal | ICD-10-CM | POA: Diagnosis present

## 2014-11-23 DIAGNOSIS — K8020 Calculus of gallbladder without cholecystitis without obstruction: Secondary | ICD-10-CM

## 2014-11-23 DIAGNOSIS — R109 Unspecified abdominal pain: Secondary | ICD-10-CM | POA: Diagnosis present

## 2014-11-23 DIAGNOSIS — K8050 Calculus of bile duct without cholangitis or cholecystitis without obstruction: Secondary | ICD-10-CM | POA: Diagnosis present

## 2014-11-23 LAB — COMPREHENSIVE METABOLIC PANEL
ALBUMIN: 4.3 g/dL (ref 3.5–5.0)
ALK PHOS: 339 U/L — AB (ref 38–126)
ALT: 133 U/L — AB (ref 17–63)
ANION GAP: 7 (ref 5–15)
AST: 39 U/L (ref 15–41)
BILIRUBIN TOTAL: 1.4 mg/dL — AB (ref 0.3–1.2)
BUN: 8 mg/dL (ref 6–20)
CALCIUM: 9.4 mg/dL (ref 8.9–10.3)
CO2: 29 mmol/L (ref 22–32)
CREATININE: 0.85 mg/dL (ref 0.61–1.24)
Chloride: 100 mmol/L — ABNORMAL LOW (ref 101–111)
GFR calc Af Amer: >60 mL/min (ref >60)
GFR calc non Af Amer: >60 mL/min (ref >60)
GLUCOSE: 116 mg/dL — AB (ref 65–99)
Potassium: 4.2 mmol/L (ref 3.5–5.1)
SODIUM: 136 mmol/L (ref 135–145)
TOTAL PROTEIN: 7.2 g/dL (ref 6.5–8.1)

## 2014-11-23 LAB — CBC
HCT: 42.9 % (ref 39.0–52.0)
HEMOGLOBIN: 15.2 g/dL (ref 13.0–17.0)
MCH: 32.9 pg (ref 26.0–34.0)
MCHC: 35.4 g/dL (ref 30.0–36.0)
MCV: 92.9 fL (ref 78.0–100.0)
PLATELETS: 269 10*3/uL (ref 150–400)
RBC: 4.62 MIL/uL (ref 4.22–5.81)
RDW: 12.7 % (ref 11.5–15.5)
WBC: 11.4 10*3/uL — ABNORMAL HIGH (ref 4.0–10.5)

## 2014-11-23 LAB — URINALYSIS, ROUTINE W REFLEX MICROSCOPIC
BILIRUBIN URINE: NEGATIVE
GLUCOSE, UA: NEGATIVE mg/dL
HGB URINE DIPSTICK: NEGATIVE
KETONES UR: NEGATIVE mg/dL
Leukocytes, UA: NEGATIVE
Nitrite: NEGATIVE
PROTEIN: NEGATIVE mg/dL
Specific Gravity, Urine: 1.016 (ref 1.005–1.030)
UROBILINOGEN UA: 0.2 mg/dL (ref 0.0–1.0)
pH: 8 (ref 5.0–8.0)

## 2014-11-23 LAB — LIPASE, BLOOD: Lipase: 39 U/L (ref 11–51)

## 2014-11-23 MED ORDER — SODIUM CHLORIDE 0.9 % IV BOLUS (SEPSIS)
1000.0000 mL | Freq: Once | INTRAVENOUS | Status: AC
  Administered 2014-11-23: 1000 mL via INTRAVENOUS

## 2014-11-23 MED ORDER — HYDROMORPHONE HCL 1 MG/ML IJ SOLN
0.5000 mg | Freq: Once | INTRAMUSCULAR | Status: AC
  Administered 2014-11-23: 0.5 mg via INTRAVENOUS
  Filled 2014-11-23: qty 1

## 2014-11-23 MED ORDER — PANTOPRAZOLE SODIUM 40 MG IV SOLR
40.0000 mg | Freq: Once | INTRAVENOUS | Status: AC
  Administered 2014-11-23: 40 mg via INTRAVENOUS
  Filled 2014-11-23: qty 40

## 2014-11-23 MED ORDER — ONDANSETRON HCL 4 MG/2ML IJ SOLN
4.0000 mg | Freq: Once | INTRAMUSCULAR | Status: AC
  Administered 2014-11-23: 4 mg via INTRAVENOUS
  Filled 2014-11-23: qty 2

## 2014-11-23 NOTE — ED Provider Notes (Signed)
CSN: [id number]     Arrival date & time 11/23/14  1648 History   First MD Initiated Contact with Patient 11/23/14 1854     Chief Complaint  Patient presents with  . Abdominal Pain     (Consider location/radiation/quality/duration/timing/severity/associated sxs/prior Treatment) HPI   Pt with hx GERD p/w epigastric pain with intermittent nausea, intermittent diarrhea, occasional bloating, early satiety.  Pain is epigastric, usually worse several hours after eating, has been constant since 7am today, worse with drinking liquids.  This has been ongoing intermittently since early September.  Is taking tums, zantac, and omeprazole.  Was seen at occupational health clinic today for same and was given GI cocktail with no relief.  Had chills today.  Denies known fevers, CP, SOB, cough.  Has never had abdominal surgery.  Has noted dark urine but no pain, urgency, or frequency.  Has been drinking less because it causes his pain to increased.  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  Prior to September used "a lot" of NSAIDs, but has not since.   Was seen in ED 10/05/14 for similar symptoms, relieved with GI cocktail at that time.    Past Medical History  Diagnosis Date  . Bipolar 1 disorder (HCC)   . Bipolar disorder [facility](HCC)    Past Surgical History  Procedure Laterality Date  . Femur surgery    . Tonsillectomy     No family history on file. Social History  Substance Use Topics  . Smoking status: Never Smoker   . Smokeless tobacco: None  . Alcohol Use: No    Review of Systems  All other systems reviewed and are negative.     Allergies  Review of patient's allergies indicates no known allergies.  Home Medications   Prior to Admission medications   Medication Sig Start Date End Date Taking? Authorizing Provider  divalproex (DEPAKOTE ER) 500 MG 24 hr tablet Take 2 tablets by mouth every morning. 09/21/14   Historical Provider, MD  fluticasone (FLONASE) 50 MCG/ACT nasal  spray Place 2 sprays into both nostrils daily as needed.  09/15/14   Historical Provider, MD  olopatadine (PATANOL) 0.1 % ophthalmic solution INSTILL 1 DROP INTO BOTH EYES TWICE DAILY AS NEEDED AT 6-8 HOUR INTERVALS. 09/24/14   Historical Provider, MD  QUEtiapine (SEROQUEL) 400 MG tablet Take 400 mg by mouth at bedtime.    Historical Provider, MD  venlafaxine XR (EFFEXOR-XR) 150 MG 24 hr capsule Take 300 mg by mouth daily with breakfast.    Historical Provider, MD   BP 146/87 mmHg  Pulse 69  Temp(Src) 98.5 F (36.9 C) (Oral)  Resp 16  SpO2 100% Physical Exam  Constitutional: He appears well-developed and well-nourished. No distress.  HENT:  Head: Normocephalic and atraumatic.  Neck: Neck supple.  Cardiovascular: Normal rate and regular rhythm.   Pulmonary/Chest: Effort normal and breath sounds normal. No respiratory distress. He has no wheezes. He has no rales.  Abdominal: Soft. He exhibits no distension and no mass. There is tenderness in the right upper quadrant and epigastric area. There is no rebound and no guarding.  Neurological: He is alert. He exhibits normal muscle tone.  Skin: He is not diaphoretic.  Nursing note and vitals reviewed.   ED Course  Procedures (including critical care time) Labs Review Labs Reviewed  COMPREHENSIVE METABOLIC PANEL - Abnormal; Notable for the following:    Chloride 100 (*)    Glucose, Bld 116 (*)    ALT 133 (*)  Alkaline Phosphatase 339 (*)    Total Bilirubin 1.4 (*)    All other components within normal limits  CBC - Abnormal; Notable for the following:    WBC 11.4 (*)    All other components within normal limits  LIPASE, BLOOD  URINALYSIS, ROUTINE W REFLEX MICROSCOPIC (NOT AT [facility])  I-STAT CG4 LACTIC ACID, ED    Imaging Review US Abdomen Limited Ruq  11/23/2014  CLINICAL DATA:  Pain EXAM: US ABDOMEN LIMITED - RIGHT UPPER QUADRANT COMPARISON:  None. FINDINGS: Gallbladder: Multiple layering small gallstones. 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 No pericholecystic fluid. Negative sonographic Murphy's sign. Common bile duct: Diameter: 9 mm, although smoothly tapering towards the ampulla. No choledocholithiasis is seen. Liver: No focal lesion identified. Within normal limits in parenchymal echogenicity. IMPRESSION: Cholelithiasis, without associated sonographic findings to suggest acute cholecystitis. Common duct is mildly prominent centrally, measuring 9 mm, smoothly tapering towards the ampulla. No choledocholithiasis is evident on MRI. Electronically Signed   By: Charline Bills M.D.   On: 11/23/2014 21:34   I have personally reviewed and evaluated these images and lab results as part of my medical decision-making.   EKG Interpretation None       12:14 AM Discussed pt with Dr Daphine Deutscher who requests medical admission and will see patient in the morning.    12:31 AM Pt admitted to [facility], Dr Felipa Furnace accepting.    MDM   Final diagnoses:  Pain  Cholecystitis    Nontoxic patient with upper abdominal pain, intermittent nausea, weeks of diarrhea, bloating and early satiety.  Constant pain since this morning.  Labs significant for elevated LFTs and leukocytosis (11).  US shows multiple layered gallstones.  Pt with persistent pain, continued tenderness in RUQ.  Concern for early cholecystitis.  Discussed pt and results with Dr Daphine Deutscher who requested medical admission . Pt admitted to [facility].      [place], PA-C 11/24/14 0032  Linwood Dibbles, MD 11/24/14 [phone]

## 2014-11-23 NOTE — ED Notes (Signed)
States he had diarrhea a couple of days ago and took an antidiarrheal.

## 2014-11-23 NOTE — ED Notes (Signed)
Pt c/o upper abd pain since 0700 this morning.  States he saw his doctor and was referred here.  Was given a GI cocktail with no relief.  States he has been here before for the same problems.

## 2014-11-24 ENCOUNTER — Encounter (HOSPITAL_COMMUNITY): Payer: Self-pay | Admitting: Internal Medicine

## 2014-11-24 ENCOUNTER — Inpatient Hospital Stay (HOSPITAL_COMMUNITY): Payer: BLUE CROSS/BLUE SHIELD

## 2014-11-24 ENCOUNTER — Encounter (HOSPITAL_COMMUNITY)
Admission: EM | Disposition: A | Discharge status: Home / Self Care | Payer: Self-pay | Source: Home / Self Care | Attending: Emergency Medicine

## 2014-11-24 ENCOUNTER — Inpatient Hospital Stay (HOSPITAL_COMMUNITY): Payer: BLUE CROSS/BLUE SHIELD | Admitting: Registered Nurse

## 2014-11-24 DIAGNOSIS — K819 Cholecystitis, unspecified: Secondary | ICD-10-CM | POA: Insufficient documentation

## 2014-11-24 DIAGNOSIS — R1011 Right upper quadrant pain: Secondary | ICD-10-CM | POA: Diagnosis not present

## 2014-11-24 DIAGNOSIS — F319 Bipolar disorder, unspecified: Secondary | ICD-10-CM | POA: Diagnosis not present

## 2014-11-24 DIAGNOSIS — K8000 Calculus of gallbladder with acute cholecystitis without obstruction: Secondary | ICD-10-CM | POA: Diagnosis present

## 2014-11-24 DIAGNOSIS — K8050 Calculus of bile duct without cholangitis or cholecystitis without obstruction: Secondary | ICD-10-CM | POA: Diagnosis present

## 2014-11-24 HISTORY — PX: CHOLECYSTECTOMY: SHX55

## 2014-11-24 LAB — CBC
HCT: 42.8 % (ref 39.0–52.0)
HEMOGLOBIN: 14.9 g/dL (ref 13.0–17.0)
MCH: 32.7 pg (ref 26.0–34.0)
MCHC: 34.8 g/dL (ref 30.0–36.0)
MCV: 93.9 fL (ref 78.0–100.0)
PLATELETS: 262 10*3/uL (ref 150–400)
RBC: 4.56 MIL/uL (ref 4.22–5.81)
RDW: 12.8 % (ref 11.5–15.5)
WBC: 13.5 10*3/uL — AB (ref 4.0–10.5)

## 2014-11-24 LAB — SURGICAL PCR SCREEN
MRSA, PCR: NEGATIVE
STAPHYLOCOCCUS AUREUS: NEGATIVE

## 2014-11-24 LAB — ABO/RH: ABO/RH(D): B POS

## 2014-11-24 LAB — TYPE AND SCREEN
ABO/RH(D): B POS
Antibody Screen: NEGATIVE

## 2014-11-24 LAB — COMPREHENSIVE METABOLIC PANEL
ALT: 99 U/L — AB (ref 17–63)
ANION GAP: 7 (ref 5–15)
AST: 33 U/L (ref 15–41)
Albumin: 3.7 g/dL (ref 3.5–5.0)
Alkaline Phosphatase: 283 U/L — ABNORMAL HIGH (ref 38–126)
BUN: 8 mg/dL (ref 6–20)
CHLORIDE: 101 mmol/L (ref 101–111)
CO2: 30 mmol/L (ref 22–32)
Calcium: 8.9 mg/dL (ref 8.9–10.3)
Creatinine, Ser: 0.83 mg/dL (ref 0.61–1.24)
Glucose, Bld: 116 mg/dL — ABNORMAL HIGH (ref 65–99)
POTASSIUM: 4.2 mmol/L (ref 3.5–5.1)
SODIUM: 138 mmol/L (ref 135–145)
Total Bilirubin: 1.2 mg/dL (ref 0.3–1.2)
Total Protein: 6.4 g/dL — ABNORMAL LOW (ref 6.5–8.1)

## 2014-11-24 LAB — PHOSPHORUS: PHOSPHORUS: 4.3 mg/dL (ref 2.5–4.6)

## 2014-11-24 LAB — TSH: TSH: 3.905 u[IU]/mL (ref 0.350–4.500)

## 2014-11-24 LAB — I-STAT CG4 LACTIC ACID, ED: LACTIC ACID, VENOUS: 1.21 mmol/L (ref 0.5–2.0)

## 2014-11-24 LAB — MAGNESIUM: MAGNESIUM: 1.9 mg/dL (ref 1.7–2.4)

## 2014-11-24 SURGERY — LAPAROSCOPIC CHOLECYSTECTOMY WITH INTRAOPERATIVE CHOLANGIOGRAM
Anesthesia: General | Site: Abdomen

## 2014-11-24 MED ORDER — ONDANSETRON HCL 4 MG/2ML IJ SOLN
INTRAMUSCULAR | Status: DC | PRN
  Administered 2014-11-24: 4 mg via INTRAVENOUS

## 2014-11-24 MED ORDER — VENLAFAXINE HCL ER 150 MG PO CP24
300.0000 mg | ORAL_CAPSULE | Freq: Every day | ORAL | Status: DC
  Administered 2014-11-25 – 2014-11-26 (×2): 300 mg via ORAL
  Filled 2014-11-24 (×3): qty 2

## 2014-11-24 MED ORDER — GLYCOPYRROLATE 0.2 MG/ML IJ SOLN
INTRAMUSCULAR | Status: DC | PRN
  Administered 2014-11-24: .8 mg via INTRAVENOUS

## 2014-11-24 MED ORDER — QUETIAPINE FUMARATE 400 MG PO TABS
400.0000 mg | ORAL_TABLET | Freq: Every day | ORAL | Status: DC
  Administered 2014-11-24 – 2014-11-25 (×2): 400 mg via ORAL
  Filled 2014-11-24 (×3): qty 1

## 2014-11-24 MED ORDER — SUCCINYLCHOLINE CHLORIDE 20 MG/ML IJ SOLN
INTRAMUSCULAR | Status: DC | PRN
  Administered 2014-11-24: 100 mg via INTRAVENOUS

## 2014-11-24 MED ORDER — PANTOPRAZOLE SODIUM 40 MG IV SOLR
40.0000 mg | Freq: Every day | INTRAVENOUS | Status: DC
  Administered 2014-11-24 – 2014-11-25 (×3): 40 mg via INTRAVENOUS
  Filled 2014-11-24 (×4): qty 40

## 2014-11-24 MED ORDER — GLYCOPYRROLATE 0.2 MG/ML IJ SOLN
INTRAMUSCULAR | Status: AC
  Filled 2014-11-24: qty 3

## 2014-11-24 MED ORDER — ONDANSETRON HCL 4 MG/2ML IJ SOLN: 4.0000 mg | Freq: Three times a day (TID) | INTRAMUSCULAR | Status: DC | PRN

## 2014-11-24 MED ORDER — PROPOFOL 10 MG/ML IV BOLUS
INTRAVENOUS | Status: AC
  Filled 2014-11-24: qty 20

## 2014-11-24 MED ORDER — KETOROLAC TROMETHAMINE 30 MG/ML IJ SOLN
INTRAMUSCULAR | Status: AC
  Filled 2014-11-24: qty 1

## 2014-11-24 MED ORDER — ONDANSETRON HCL 4 MG PO TABS: 4.0000 mg | ORAL_TABLET | Freq: Four times a day (QID) | ORAL | Status: DC | PRN

## 2014-11-24 MED ORDER — BUPIVACAINE-EPINEPHRINE (PF) 0.25% -1:200000 IJ SOLN
INTRAMUSCULAR | Status: AC
  Filled 2014-11-24: qty 30

## 2014-11-24 MED ORDER — MIDAZOLAM HCL 2 MG/2ML IJ SOLN
INTRAMUSCULAR | Status: AC
  Filled 2014-11-24: qty 4

## 2014-11-24 MED ORDER — LACTATED RINGERS IR SOLN
Status: DC | PRN
  Administered 2014-11-24: 1

## 2014-11-24 MED ORDER — DIVALPROEX SODIUM ER 500 MG PO TB24
1000.0000 mg | ORAL_TABLET | Freq: Every day | ORAL | Status: DC
  Administered 2014-11-24 – 2014-11-25 (×2): 1000 mg via ORAL
  Filled 2014-11-24 (×3): qty 2

## 2014-11-24 MED ORDER — ROCURONIUM BROMIDE 100 MG/10ML IV SOLN
INTRAVENOUS | Status: AC
  Filled 2014-11-24: qty 1

## 2014-11-24 MED ORDER — FENTANYL CITRATE (PF) 100 MCG/2ML IJ SOLN
INTRAMUSCULAR | Status: AC
  Filled 2014-11-24: qty 4

## 2014-11-24 MED ORDER — ONDANSETRON HCL 4 MG/2ML IJ SOLN: 4.0000 mg | Freq: Four times a day (QID) | INTRAMUSCULAR | Status: DC | PRN

## 2014-11-24 MED ORDER — HYDROMORPHONE HCL 1 MG/ML IJ SOLN
0.2500 mg | INTRAMUSCULAR | Status: DC | PRN
  Administered 2014-11-24 (×2): 0.5 mg via INTRAVENOUS

## 2014-11-24 MED ORDER — MEPERIDINE HCL 25 MG/ML IJ SOLN: 6.2500 mg | INTRAMUSCULAR | Status: DC | PRN

## 2014-11-24 MED ORDER — ACETAMINOPHEN 650 MG RE SUPP
650.0000 mg | Freq: Four times a day (QID) | RECTAL | Status: DC | PRN
Start: 2014-11-24 — End: 2014-11-26

## 2014-11-24 MED ORDER — KETOROLAC TROMETHAMINE 30 MG/ML IJ SOLN
30.0000 mg | Freq: Once | INTRAMUSCULAR | Status: AC | PRN
  Administered 2014-11-24: 30 mg via INTRAVENOUS

## 2014-11-24 MED ORDER — GLYCOPYRROLATE 0.2 MG/ML IJ SOLN
INTRAMUSCULAR | Status: AC
  Filled 2014-11-24: qty 1

## 2014-11-24 MED ORDER — MIDAZOLAM HCL 5 MG/5ML IJ SOLN
INTRAMUSCULAR | Status: DC | PRN
  Administered 2014-11-24: 2 mg via INTRAVENOUS

## 2014-11-24 MED ORDER — LIDOCAINE HCL (CARDIAC) 20 MG/ML IV SOLN
INTRAVENOUS | Status: DC | PRN
  Administered 2014-11-24: 80 mg via INTRAVENOUS

## 2014-11-24 MED ORDER — NEOSTIGMINE METHYLSULFATE 10 MG/10ML IV SOLN
INTRAVENOUS | Status: AC
  Filled 2014-11-24: qty 1

## 2014-11-24 MED ORDER — FENTANYL CITRATE (PF) 250 MCG/5ML IJ SOLN
INTRAMUSCULAR | Status: AC
  Filled 2014-11-24: qty 25

## 2014-11-24 MED ORDER — LACTATED RINGERS IV SOLN: INTRAVENOUS | Status: DC

## 2014-11-24 MED ORDER — HYDROCODONE-ACETAMINOPHEN 5-325 MG PO TABS
1.0000 | ORAL_TABLET | ORAL | Status: DC | PRN
  Administered 2014-11-24: 1 via ORAL
  Administered 2014-11-26: 2 via ORAL
  Filled 2014-11-24: qty 1
  Filled 2014-11-24: qty 2

## 2014-11-24 MED ORDER — IOHEXOL 300 MG/ML  SOLN
INTRAMUSCULAR | Status: DC | PRN
  Administered 2014-11-24: 7 mL

## 2014-11-24 MED ORDER — ONDANSETRON HCL 4 MG/2ML IJ SOLN
INTRAMUSCULAR | Status: AC
  Filled 2014-11-24: qty 2

## 2014-11-24 MED ORDER — HYDROMORPHONE HCL 1 MG/ML IJ SOLN
INTRAMUSCULAR | Status: AC
  Filled 2014-11-24: qty 1

## 2014-11-24 MED ORDER — FENTANYL CITRATE (PF) 100 MCG/2ML IJ SOLN
INTRAMUSCULAR | Status: DC | PRN
  Administered 2014-11-24: 50 ug via INTRAVENOUS
  Administered 2014-11-24: 100 ug via INTRAVENOUS
  Administered 2014-11-24 (×3): 50 ug via INTRAVENOUS

## 2014-11-24 MED ORDER — LIDOCAINE HCL (CARDIAC) 20 MG/ML IV SOLN
INTRAVENOUS | Status: AC
  Filled 2014-11-24: qty 5

## 2014-11-24 MED ORDER — PIPERACILLIN-TAZOBACTAM 3.375 G IVPB
3.3750 g | Freq: Three times a day (TID) | INTRAVENOUS | Status: DC
  Administered 2014-11-24 – 2014-11-26 (×8): 3.375 g via INTRAVENOUS
  Filled 2014-11-24 (×9): qty 50

## 2014-11-24 MED ORDER — BUPIVACAINE-EPINEPHRINE 0.25% -1:200000 IJ SOLN
INTRAMUSCULAR | Status: DC | PRN
  Administered 2014-11-24: 25 mL

## 2014-11-24 MED ORDER — SODIUM CHLORIDE 0.9 % IV SOLN
INTRAVENOUS | Status: AC
  Administered 2014-11-24: 02:00:00 via INTRAVENOUS

## 2014-11-24 MED ORDER — ROCURONIUM BROMIDE 100 MG/10ML IV SOLN
INTRAVENOUS | Status: DC | PRN
  Administered 2014-11-24: 5 mg via INTRAVENOUS
  Administered 2014-11-24: 30 mg via INTRAVENOUS

## 2014-11-24 MED ORDER — HYDROMORPHONE HCL 1 MG/ML IJ SOLN
0.5000 mg | INTRAMUSCULAR | Status: DC | PRN
Start: 2014-11-24 — End: 2014-11-26
  Administered 2014-11-24 – 2014-11-25 (×8): 0.5 mg via INTRAVENOUS
  Filled 2014-11-24 (×8): qty 1

## 2014-11-24 MED ORDER — ACETAMINOPHEN 325 MG PO TABS: 650.0000 mg | ORAL_TABLET | Freq: Four times a day (QID) | ORAL | Status: DC | PRN

## 2014-11-24 MED ORDER — PROMETHAZINE HCL 25 MG/ML IJ SOLN: 6.2500 mg | INTRAMUSCULAR | Status: DC | PRN

## 2014-11-24 MED ORDER — NEOSTIGMINE METHYLSULFATE 10 MG/10ML IV SOLN
INTRAVENOUS | Status: DC | PRN
  Administered 2014-11-24: 5 mg via INTRAVENOUS

## 2014-11-24 MED ORDER — LACTATED RINGERS IV SOLN
INTRAVENOUS | Status: DC | PRN
  Administered 2014-11-24: 12:00:00 via INTRAVENOUS

## 2014-11-24 MED ORDER — PROPOFOL 10 MG/ML IV BOLUS
INTRAVENOUS | Status: DC | PRN
  Administered 2014-11-24: 150 mg via INTRAVENOUS

## 2014-11-24 MED ORDER — HYDROMORPHONE HCL 1 MG/ML IJ SOLN
1.0000 mg | INTRAMUSCULAR | Status: DC | PRN
Start: 2014-11-24 — End: 2014-11-24

## 2014-11-24 SURGICAL SUPPLY — 34 items
APPLIER CLIP 5 13 M/L LIGAMAX5 (MISCELLANEOUS) ×3
APR CLP MED LRG 5 ANG JAW (MISCELLANEOUS) ×1
BAG SPEC RTRVL LRG 6X4 10 (ENDOMECHANICALS) ×1
CATH REDDICK CHOLANGI 4FR 50CM (CATHETERS) ×3 IMPLANT
CHLORAPREP W/TINT 26ML (MISCELLANEOUS) ×3 IMPLANT
CLIP APPLIE 5 13 M/L LIGAMAX5 (MISCELLANEOUS) ×1 IMPLANT
COVER MAYO STAND STRL (DRAPES) ×3 IMPLANT
COVER SURGICAL LIGHT HANDLE (MISCELLANEOUS) ×3 IMPLANT
DECANTER SPIKE VIAL GLASS SM (MISCELLANEOUS) ×3 IMPLANT
DRAIN CHANNEL RND F F (WOUND CARE) ×2 IMPLANT
DRAPE C-ARM 42X120 X-RAY (DRAPES) ×3 IMPLANT
DRAPE LAPAROSCOPIC ABDOMINAL (DRAPES) ×3 IMPLANT
ELECT REM PT RETURN 9FT ADLT (ELECTROSURGICAL) ×3
ELECTRODE REM PT RTRN 9FT ADLT (ELECTROSURGICAL) ×1 IMPLANT
EVACUATOR SILICONE 100CC (DRAIN) ×2 IMPLANT
GLOVE BIO SURGEON STRL SZ7.5 (GLOVE) ×6 IMPLANT
GOWN STRL REUS W/ TWL XL LVL3 (GOWN DISPOSABLE) IMPLANT
GOWN STRL REUS W/TWL LRG LVL3 (GOWN DISPOSABLE) ×3 IMPLANT
GOWN STRL REUS W/TWL XL LVL3 (GOWN DISPOSABLE) ×3 IMPLANT
HEMOSTAT SURGICEL 4X8 (HEMOSTASIS) IMPLANT
IV CATH 14GX2 1/4 (CATHETERS) ×3 IMPLANT
KIT BASIN OR (CUSTOM PROCEDURE TRAY) ×3 IMPLANT
LIQUID BAND (GAUZE/BANDAGES/DRESSINGS) ×3 IMPLANT
POUCH SPECIMEN RETRIEVAL 10MM (ENDOMECHANICALS) ×2 IMPLANT
SET IRRIG TUBING LAPAROSCOPIC (IRRIGATION / IRRIGATOR) ×3 IMPLANT
SLEEVE XCEL OPT CAN 5 100 (ENDOMECHANICALS) ×6 IMPLANT
SPONGE DRAIN TRACH 4X4 STRL 2S (GAUZE/BANDAGES/DRESSINGS) ×2 IMPLANT
SUT MNCRL AB 4-0 PS2 18 (SUTURE) ×3 IMPLANT
TAPE CLOTH SURG 4X10 WHT LF (GAUZE/BANDAGES/DRESSINGS) ×2 IMPLANT
TOWEL OR 17X26 10 PK STRL BLUE (TOWEL DISPOSABLE) ×3 IMPLANT
TOWEL OR NON WOVEN STRL DISP B (DISPOSABLE) ×3 IMPLANT
TRAY LAPAROSCOPIC (CUSTOM PROCEDURE TRAY) ×3 IMPLANT
TROCAR BLADELESS OPT 5 100 (ENDOMECHANICALS) ×3 IMPLANT
TROCAR XCEL BLUNT TIP 100MML (ENDOMECHANICALS) ×3 IMPLANT

## 2014-11-24 NOTE — ED Provider Notes (Signed)
Pt presents with constant abdominal pain since 0700.  Pain is in the ruq. Physical Exam  BP 142/76 mmHg  Pulse 75  Temp(Src) 98.3 F (36.8 C) (Oral)  Resp 18  SpO2 100%  Physical Exam  Constitutional: He appears well-developed and well-nourished. No distress.  HENT:  Head: Normocephalic and atraumatic.  Right Ear: External ear normal.  Left Ear: External ear normal.  Eyes: Conjunctivae are normal. Right eye exhibits no discharge. Left eye exhibits no discharge. No scleral icterus.  Neck: Neck supple. No tracheal deviation present.  Cardiovascular: Normal rate.   Pulmonary/Chest: Effort normal. No stridor. No respiratory distress.  Abdominal: There is tenderness in the right upper quadrant. There is positive Murphy's sign.  Musculoskeletal: He exhibits no edema.  Neurological: He is alert. Cranial nerve deficit: no gross deficits.  Skin: Skin is warm and dry. No rash noted.  Psychiatric: He has a normal mood and affect.  Nursing note and vitals reviewed.   ED Course  Procedures  MDM US shows gallstones.  Constant pain is concerning for early cholecystitis.  Will consult with general surgery.     Linwood DibblesJon Corlis Angelica, MD 11/24/14 [phone]

## 2014-11-24 NOTE — Anesthesia Postprocedure Evaluation (Signed)
Anesthesia Post Note  Patient: Brendan [name]  Procedure(s) Performed: Procedure(s) (LRB): LAPAROSCOPIC CHOLECYSTECTOMY WITH INTRAOPERATIVE CHOLANGIOGRAM (N/A)  Anesthesia type: General  Patient location: PACU  Post pain: Pain level controlled  Post assessment: Post-op Vital signs reviewed  Last Vitals:  Filed Vitals:   11/24/14 1430  BP:   Pulse:   Temp: 37.1 C  Resp:     Post vital signs: Reviewed  Level of consciousness: sedated  Complications: No apparent anesthesia complications

## 2014-11-24 NOTE — Anesthesia Procedure Notes (Signed)
Procedure Name: Intubation Date/Time: 11/24/2014 12:44 PM Performed by: Jarvis NewcomerARMISTEAD, Prudy Candy A Pre-anesthesia Checklist: Patient identified, Timeout performed, Emergency Drugs available, Suction available and Patient being monitored Patient Re-evaluated:Patient Re-evaluated prior to inductionOxygen Delivery Method: Circle system utilized Preoxygenation: Pre-oxygenation with 100% oxygen Intubation Type: IV induction Ventilation: Mask ventilation without difficulty Laryngoscope Size: Mac and 4 Grade View: Grade I Tube type: Oral Tube size: 7.5 mm Number of attempts: 1 Airway Equipment and Method: Stylet Placement Confirmation: ETT inserted through vocal cords under direct vision,  breath sounds checked- equal and bilateral and positive ETCO2 Secured at: 22 cm Tube secured with: Tape Dental Injury: Teeth and Oropharynx as per pre-operative assessment

## 2014-11-24 NOTE — Progress Notes (Signed)
Have seen and assessed patient and agree with Dr. Reginia Naasobis assessment and plan. Patient presenting with probable early acute cholecystitis versus biliary colic versus symptomatic gallstones. Patient noted on admission to have elevated LFTs which are trending down. White count increasing and currently at 13.5 from 11.4 on admission Abdominal ultrasound with cholelithiasis. Patient has been seen in consultation by general surgery and patient for cholecystectomy this afternoon. Continue pain management, empiric IV antibiotics, supportive care.

## 2014-11-24 NOTE — Transfer of Care (Signed)
Immediate Anesthesia Transfer of Care Note  Patient: Brendan [name]  Procedure(s) Performed: Procedure(s): LAPAROSCOPIC CHOLECYSTECTOMY WITH INTRAOPERATIVE CHOLANGIOGRAM (N/A)  Patient Location: PACU  Anesthesia Type:General  Level of Consciousness: awake, alert , oriented and patient cooperative  Airway & Oxygen Therapy: Patient Spontanous Breathing and Patient connected to face mask oxygen  Post-op Assessment: Report given to RN, Post -op Vital signs reviewed and stable and Patient moving all extremities  Post vital signs: Reviewed and stable  Last Vitals:  Filed Vitals:   11/24/14 1117  BP: 110/57  Pulse: 80  Temp: 36.7 C  Resp: 18    Complications: No apparent anesthesia complications

## 2014-11-24 NOTE — Op Note (Signed)
11/23/2014 - 11/24/2014  2:12 PM  PATIENT:  Brendan [name]  54 y.o. male  PRE-OPERATIVE DIAGNOSIS:  Cholecystitis with cholelithiasis  POST-OPERATIVE DIAGNOSIS:  Cholecystitis with cholelithiasis  PROCEDURE:  Procedure(s): LAPAROSCOPIC CHOLECYSTECTOMY WITH INTRAOPERATIVE CHOLANGIOGRAM (N/A)  SURGEON:  Surgeon(s) and Role:    * Chevis Pretty III, MD - Primary  PHYSICIAN ASSISTANT:   ASSISTANTS: none   ANESTHESIA:   general  EBL:  Total I/O In: 1000 [I.V.:1000] Out: 200 [Urine:200]  BLOOD ADMINISTERED:none  DRAINS: (1) Jackson-Pratt drain(s) with closed bulb suction in the gallbladder bed of liver   LOCAL MEDICATIONS USED:  MARCAINE     SPECIMEN:  Source of Specimen:  gallbladder  DISPOSITION OF SPECIMEN:  PATHOLOGY  COUNTS:  YES  TOURNIQUET:  * No tourniquets in log *  DICTATION: .Dragon Dictation   Procedure: After informed consent was obtained the patient was brought to the operating room and placed in the supine position on the operating room table. After adequate induction of general anesthesia the patient's abdomen was prepped with ChloraPrep allowed to dry and draped in usual sterile manner. The area below the umbilicus was infiltrated with quarter percent  Marcaine. A small incision was made with a 15 blade knife. The incision was carried down through the subcutaneous tissue bluntly with a hemostat and [employer] retractors. The linea alba was identified. The linea alba was incised with a 15 blade knife and each side was grasped with Coker clamps. The preperitoneal space was then probed with a hemostat until the peritoneum was opened and access was gained to the abdominal cavity. A 0 Vicryl pursestring stitch was placed in the fascia surrounding the opening. A Hassan cannula was then placed through the opening and anchored in place with the previously placed Vicryl purse string stitch. The abdomen was insufflated with carbon dioxide without difficulty. A laparoscope was  inserted through the [facility] cannula in the right upper quadrant was inspected. Next the epigastric region was infiltrated with % Marcaine. A small incision was made with a 15 blade knife. A 5 mm port was placed bluntly through this incision into the abdominal cavity under direct vision. Next 2 sites were chosen laterally on the right side of the abdomen for placement of 5 mm ports. Each of these areas was infiltrated with quarter percent Marcaine. Small stab incisions were made with a 15 blade knife. 5 mm ports were then placed bluntly through these incisions into the abdominal cavity under direct vision without difficulty. A blunt grasper was placed through the lateralmost 5 mm port and used to grasp the dome of the gallbladder and elevated anteriorly and superiorly. Another blunt grasper was placed through the other 5 mm port and used to retract the body and neck of the gallbladder. The gallbladder was very thick walled and inflamed and had to be aspirated so it could be grasped. A dissector was placed through the epigastric port and using the electrocautery the peritoneal reflection at the gallbladder neck was opened. Blunt dissection was then carried out in this area until the gallbladder neck-cystic duct junction was readily identified and a good window was created. A single clip was placed on the gallbladder neck. A small  ductotomy was made just below the clip with laparoscopic scissors. A 14-gauge Angiocath was then placed through the anterior abdominal wall under direct vision. A Reddick cholangiogram catheter was then placed through the Angiocath and flushed. The catheter was then placed in the cystic duct and anchored in place with a clip. A cholangiogram was  obtained that showed no filling defects good emptying into the duodenum an adequate length on the cystic duct. The anchoring clip and catheters were then removed from the patient. 2 clips were placed proximally on the cystic duct and the duct was  divided between the 2 sets of clips. Posterior to this the cystic artery was identified and again dissected bluntly in a circumferential manner until a good window  was created. 2 clips were placed proximally and one distally on the artery and the artery was divided between the 2 sets of clips. Next a laparoscopic hook cautery device was used to separate the gallbladder from the liver bed. Prior to completely detaching the gallbladder from the liver bed the liver bed was inspected and several small bleeding points were coagulated with the electrocautery until the area was completely hemostatic. The gallbladder was then detached the rest of it from the liver bed without difficulty. A laparoscopic bag was inserted through the hassan port. The laparoscope was moved to the epigastric port. The gallbladder was placed within the bag and the bag was sealed.  The bag with the gallbladder was then removed with the [facility] cannula through the infraumbilical port without difficulty. A grasper was then placed through the right lateral port and through the epigastric port. A 19French blake drain was then brought into the abdominal cavity and out the port site. The drain was placed in the gallbladder bed of the liver. The drain was anchored to the skin with a 3-0 Nylon stitch. The fascial defect was then closed with the previously placed Vicryl pursestring stitch as well as with another figure-of-eight 0 Vicryl stitch. The liver bed was inspected again and found to be hemostatic. The abdomen was irrigated with copious amounts of saline until the effluent was clear. The ports were then removed under direct vision without difficulty and were found to be hemostatic. The gas was allowed to escape. The skin incisions were all closed with interrupted 4-0 Monocryl subcuticular stitches. Dermabond dressings were applied. The patient tolerated the procedure well. At the end of the case all needle sponge and instrument counts were correct.  The patient was then awakened and taken to recovery in stable condition  PLAN OF CARE: Admit to inpatient   PATIENT DISPOSITION:  PACU - hemodynamically stable.   Delay start of Pharmacological VTE agent (>24hrs) due to surgical blood loss or risk of bleeding: no

## 2014-11-24 NOTE — ED Notes (Signed)
MD at bedside. 

## 2014-11-24 NOTE — Progress Notes (Signed)
Pt has returned from PACU. Alert, lethargic. VS WNL. 2L O2. JP intact -- dermabond sites X 3.

## 2014-11-24 NOTE — Consult Note (Signed)
Reason for Consult:abdominal pain Referring Physician: Dr. Oleh Genin [name] is an 54 y.o. male.  HPI:  The patient is a 54 year old white male who began having abdominal pain yesterday. The pain is located in his right upper quadrant and epigastric region. The pain is been associated with nausea but no vomiting. He denies any fevers or chills. This is the second time he has had a pain like this. The first time was in September. He denies any history of abdominal surgery. He came to the emergency department where a ultrasound showed stones in the gallbladder. Most of his liver functions are normal except for some slight elevation of his alkaline phosphatase and ALT  Past Medical History  Diagnosis Date  . Bipolar 1 disorder ([place])   . Bipolar disorder [facility])     Past Surgical History  Procedure Laterality Date  . Femur surgery    . Tonsillectomy      Family History  Problem Relation Age of Onset  . Hypertension Other   . CAD Other   . Dementia Other   . Hypertension Mother   . Pancreatitis Mother   . Colon polyps Father   . Stroke Father     Social History:  reports that he has never smoked. He does not have any smokeless tobacco history on file. He reports that he does not drink alcohol or use illicit drugs.  Allergies: No Known Allergies  Medications: I have reviewed the patient's current medications.  Results for orders placed or performed during the hospital encounter of 11/23/14 (from the past 48 hour(s))  Lipase, blood     Status: None   Collection Time: 11/23/14  5:23 PM  Result Value Ref Range   Lipase 39 11 - 51 U/L    Comment: Please note change in reference range.  Comprehensive metabolic panel     Status: Abnormal   Collection Time: 11/23/14  5:23 PM  Result Value Ref Range   Sodium 136 135 - 145 mmol/L   Potassium 4.2 3.5 - 5.1 mmol/L   Chloride 100 (L) 101 - 111 mmol/L   CO2 29 22 - 32 mmol/L   Glucose, Bld 116 (H) 65 - 99 mg/dL   BUN 8 6 - 20 mg/dL   Creatinine, Ser 0.85 0.61 - 1.24 mg/dL   Calcium 9.4 8.9 - 10.3 mg/dL   Total Protein 7.2 6.5 - 8.1 g/dL   Albumin 4.3 3.5 - 5.0 g/dL   AST 39 15 - 41 U/L   ALT 133 (H) 17 - 63 U/L   Alkaline Phosphatase 339 (H) 38 - 126 U/L   Total Bilirubin 1.4 (H) 0.3 - 1.2 mg/dL   GFR calc non Af Amer >60 >60 mL/min   GFR calc Af Amer >60 >60 mL/min    Comment: (NOTE) The eGFR has been calculated using the CKD EPI equation. This calculation has not been validated in all clinical situations. eGFR's persistently <60 mL/min signify possible Chronic Kidney Disease.    Anion gap 7 5 - 15  CBC     Status: Abnormal   Collection Time: 11/23/14  5:23 PM  Result Value Ref Range   WBC 11.4 (H) 4.0 - 10.5 K/uL   RBC 4.62 4.22 - 5.81 MIL/uL   Hemoglobin 15.2 13.0 - 17.0 g/dL   HCT 42.9 39.0 - 52.0 %   MCV 92.9 78.0 - 100.0 fL   MCH 32.9 26.0 - 34.0 pg   MCHC 35.4 30.0 - 36.0 g/dL   RDW 12.7  11.5 - 15.5 %   Platelets 269 150 - 400 K/uL  Urinalysis, Routine w reflex microscopic (not at [facility])     Status: None   Collection Time: 11/23/14  9:38 PM  Result Value Ref Range   Color, Urine YELLOW YELLOW   APPearance CLEAR CLEAR   Specific Gravity, Urine 1.016 1.005 - 1.030   pH 8.0 5.0 - 8.0   Glucose, UA NEGATIVE NEGATIVE mg/dL   Hgb urine dipstick NEGATIVE NEGATIVE   Bilirubin Urine NEGATIVE NEGATIVE   Ketones, ur NEGATIVE NEGATIVE mg/dL   Protein, ur NEGATIVE NEGATIVE mg/dL   Urobilinogen, UA 0.2 0.0 - 1.0 mg/dL   Nitrite NEGATIVE NEGATIVE   Leukocytes, UA NEGATIVE NEGATIVE    Comment: MICROSCOPIC NOT DONE ON URINES WITH NEGATIVE PROTEIN, BLOOD, LEUKOCYTES, NITRITE, OR GLUCOSE <1000 mg/dL.  I-Stat CG4 Lactic Acid, ED     Status: None   Collection Time: 11/24/14  1:08 AM  Result Value Ref Range   Lactic Acid, Venous 1.21 0.5 - 2.0 mmol/L  Magnesium     Status: None   Collection Time: 11/24/14  6:07 AM  Result Value Ref Range   Magnesium 1.9 1.7 - 2.4 mg/dL  Phosphorus     Status: None    Collection Time: 11/24/14  6:07 AM  Result Value Ref Range   Phosphorus 4.3 2.5 - 4.6 mg/dL  TSH     Status: None   Collection Time: 11/24/14  6:07 AM  Result Value Ref Range   TSH 3.905 0.350 - 4.500 uIU/mL  Comprehensive metabolic panel     Status: Abnormal   Collection Time: 11/24/14  6:07 AM  Result Value Ref Range   Sodium 138 135 - 145 mmol/L   Potassium 4.2 3.5 - 5.1 mmol/L   Chloride 101 101 - 111 mmol/L   CO2 30 22 - 32 mmol/L   Glucose, Bld 116 (H) 65 - 99 mg/dL   BUN 8 6 - 20 mg/dL   Creatinine, Ser 0.83 0.61 - 1.24 mg/dL   Calcium 8.9 8.9 - 10.3 mg/dL   Total Protein 6.4 (L) 6.5 - 8.1 g/dL   Albumin 3.7 3.5 - 5.0 g/dL   AST 33 15 - 41 U/L   ALT 99 (H) 17 - 63 U/L   Alkaline Phosphatase 283 (H) 38 - 126 U/L   Total Bilirubin 1.2 0.3 - 1.2 mg/dL   GFR calc non Af Amer >60 >60 mL/min   GFR calc Af Amer >60 >60 mL/min    Comment: (NOTE) The eGFR has been calculated using the CKD EPI equation. This calculation has not been validated in all clinical situations. eGFR's persistently <60 mL/min signify possible Chronic Kidney Disease.    Anion gap 7 5 - 15  CBC     Status: Abnormal   Collection Time: 11/24/14  6:07 AM  Result Value Ref Range   WBC 13.5 (H) 4.0 - 10.5 K/uL   RBC 4.56 4.22 - 5.81 MIL/uL   Hemoglobin 14.9 13.0 - 17.0 g/dL   HCT 42.8 39.0 - 52.0 %   MCV 93.9 78.0 - 100.0 fL   MCH 32.7 26.0 - 34.0 pg   MCHC 34.8 30.0 - 36.0 g/dL   RDW 12.8 11.5 - 15.5 %   Platelets 262 150 - 400 K/uL    US Abdomen Limited Ruq  11/23/2014  CLINICAL DATA:  Pain EXAM: US ABDOMEN LIMITED - RIGHT UPPER QUADRANT COMPARISON:  None. FINDINGS: Gallbladder: Multiple layering small gallstones. 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 No pericholecystic fluid.  Negative sonographic Murphy's sign. Common bile duct: Diameter: 9 mm, although smoothly tapering towards the ampulla. No choledocholithiasis is seen. Liver: No focal lesion identified. Within normal limits in parenchymal  echogenicity. IMPRESSION: Cholelithiasis, without associated sonographic findings to suggest acute cholecystitis. Common duct is mildly prominent centrally, measuring 9 mm, smoothly tapering towards the ampulla. No choledocholithiasis is evident on MRI. Electronically Signed   By: Julian Hy M.D.   On: 11/23/2014 21:34    Review of Systems  Constitutional: Negative.   HENT: Negative.   Eyes: Negative.   Respiratory: Negative.   Cardiovascular: Negative.   Gastrointestinal: Positive for nausea and abdominal pain. Negative for vomiting.  Genitourinary: Negative.   Musculoskeletal: Negative.   Skin: Negative.   Neurological: Negative.   Endo/Heme/Allergies: Negative.   Psychiatric/Behavioral: Negative.    Blood pressure 114/66, pulse 77, temperature 98.9 F (37.2 C), temperature source Oral, resp. rate 18, height 6' 1"  (1.854 m), weight 88.1 kg (194 lb 3.6 oz), SpO2 96 %. Physical Exam  Constitutional: He is oriented to person, place, and time. He appears well-developed and well-nourished.  HENT:  Head: Normocephalic and atraumatic.  Eyes: Conjunctivae and EOM are normal. Pupils are equal, round, and reactive to light.  Neck: Normal range of motion. Neck supple.  Cardiovascular: Normal rate, regular rhythm and normal heart sounds.   Respiratory: Effort normal and breath sounds normal.  GI: Soft.  There is moderate tenderness in the RUQ and epigastric area.  Musculoskeletal: Normal range of motion.  Neurological: He is alert and oriented to person, place, and time.  Skin: Skin is warm and dry.  Psychiatric: He has a normal mood and affect. His behavior is normal.    Assessment/Plan:  The patient appears to have symptomatic gallstones. Because of the risk of further painful episodes and possible pancreatitis or think he would benefit from having his gallbladder removed. I have discussed with him in detail the risks and benefits of the operation to remove the gallbladder as well  as some of the technical aspects including the risk of common duct injury and the need for open surgery and he understands and wishes to proceed  TOTH III,PAUL S 11/24/2014, 8:23 AM

## 2014-11-24 NOTE — H&P (Signed)
PCP:  Eulis Foster, FNP    Referring provider [facility] NP  Chief Complaint:   Abdominal pain HPI: Brendan [name] is a 54 y.o. male   has a past medical history of Bipolar 1 disorder (HCC) and Bipolar disorder (HCC).   Presented with upper abdominal pain since this AM morning. He first presented his primary care provider was sent to emergency department where he received GI cocktail with no improvement. Patient has history of GERD and he been having some intermittent nausea and diarrhea. Reports pain is better when he is still. Denies any fever. He's been reporting intermittent epigastric pain after eating but that has gotten bad this morning. The night before he ate lasagna. In the past he have had significant pain with fatty foods.  Patient denies any prior surgeries. 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 Emergency by me was found to have WBC of 11.4 with evidence of elevated LFTs abdominal ultrasound was done showing cholelithiasis without associated scintigraphic findings to suggest acute cholecystitis but mildly prominent common duct lipase was within normal limits. Case was discussed with surgery by ER provider who recommended medical admission for possible GI consult in AM they will see in AM as well.    Hospitalist was called for admission for abnormal LFTs and epigastric pain evidence of cholelithiasis being evaluated for possible early cholecystitis  Review of Systems:    Pertinent positives include: abdominal pain, nausea,   Constitutional:  No weight loss, night sweats, Fevers, chills, fatigue, weight loss  HEENT:  No headaches, Difficulty swallowing,Tooth/dental problems,Sore throat,  No sneezing, itching, ear ache, nasal congestion, post nasal drip,  Cardio-vascular:  No chest pain, Orthopnea, PND, anasarca, dizziness, palpitations.no Bilateral lower extremity swelling  GI:  No heartburn, indigestion, vomiting, diarrhea, change in bowel habits,  loss of appetite, melena, blood in stool, hematemesis Resp:  no shortness of breath at rest. No dyspnea on exertion, No excess mucus, no productive cough, No non-productive cough, No coughing up of blood.No change in color of mucus.No wheezing. Skin:  no rash or lesions. No jaundice GU:  no dysuria, change in color of urine, no urgency or frequency. No straining to urinate.  No flank pain.  Musculoskeletal:  No joint pain or no joint swelling. No decreased range of motion. No back pain.  Psych:  No change in mood or affect. No depression or anxiety. No memory loss.  Neuro: no localizing neurological complaints, no tingling, no weakness, no double vision, no gait abnormality, no slurred speech, no confusion  Otherwise ROS are negative except for above, 10 systems were reviewed  Past Medical History: Past Medical History  Diagnosis Date  . Bipolar 1 disorder (HCC)   . Bipolar disorder [facility])    Past Surgical History  Procedure Laterality Date  . Femur surgery    . Tonsillectomy       Medications: Prior to Admission medications   Medication Sig Start Date End Date Taking? Authorizing Provider  divalproex (DEPAKOTE ER) 500 MG 24 hr tablet Take 2 tablets by mouth at bedtime.  09/21/14  Yes Historical Provider, MD  fluticasone (FLONASE) 50 MCG/ACT nasal spray Place 2 sprays into both nostrils daily as needed for allergies or rhinitis.  09/15/14  Yes Historical Provider, MD  Multiple Vitamin (MULTIVITAMIN WITH MINERALS) TABS tablet Take 1 tablet by mouth daily.   Yes Historical Provider, MD  olopatadine (PATANOL) 0.1 % ophthalmic solution INSTILL 1 DROP INTO BOTH EYES TWICE DAILY AS NEEDED AT  6-8 HOUR INTERVALS. 09/24/14  Yes Historical Provider, MD  omeprazole (PRILOSEC) 40 MG capsule Take 40 mg by mouth daily. 11/13/14  Yes Historical Provider, MD  QUEtiapine (SEROQUEL) 400 MG tablet Take 400 mg by mouth at bedtime.   Yes Historical Provider, MD  RaNITidine HCl (ZANTAC PO) Take 1 tablet  by mouth 2 (two) times daily as needed (acid reflux).   Yes Historical Provider, MD  venlafaxine XR (EFFEXOR-XR) 150 MG 24 hr capsule Take 300 mg by mouth daily with breakfast.   Yes Historical Provider, MD    Allergies:  No Known Allergies  Social History:  Ambulatory  independently   Lives at home alone,       reports that he has never smoked. He does not have any smokeless tobacco history on file. He reports that he does not drink alcohol or use illicit drugs.    Family History: family history includes Hypertension in his other.    Physical Exam: Patient Vitals for the past 24 hrs:  BP Temp Temp src Pulse Resp SpO2  11/23/14 2356 142/76 mmHg 98.3 F (36.8 C) Oral 75 18 100 %  11/23/14 2129 147/90 mmHg - - 71 16 100 %  11/23/14 2003 140/81 mmHg - - 61 17 100 %  11/23/14 1659 146/87 mmHg 98.5 F (36.9 C) Oral 69 16 100 %    1. General:  in No Acute distress 2. Psychological: Alert and  Oriented 3. Head/ENT:     Dry Mucous Membranes                          Head Non traumatic, neck supple                          Normal   Dentition 4. SKIN:  decreased Skin turgor,  Skin clean Dry and intact no rash 5. Heart: Regular rate and rhythm no Murmur, Rub or gallop 6. Lungs: Clear to auscultation bilaterally, no wheezes or crackles   7. Abdomen: Soft, RUQ-tenderness, Non distended 8. Lower extremities: no clubbing, cyanosis, or edema 9. Neurologically Grossly intact, moving all 4 extremities equally 10. MSK: Normal range of motion  body mass index is unknown because there is no weight on file.   Labs on Admission:   Results for orders placed or performed during the hospital encounter of 11/23/14 (from the past 24 hour(s))  Lipase, blood     Status: None   Collection Time: 11/23/14  5:23 PM  Result Value Ref Range   Lipase 39 11 - 51 U/L  Comprehensive metabolic panel     Status: Abnormal   Collection Time: 11/23/14  5:23 PM  Result Value Ref Range   Sodium 136 135 -  145 mmol/L   Potassium 4.2 3.5 - 5.1 mmol/L   Chloride 100 (L) 101 - 111 mmol/L   CO2 29 22 - 32 mmol/L   Glucose, Bld 116 (H) 65 - 99 mg/dL   BUN 8 6 - 20 mg/dL   Creatinine, Ser 1.61 0.61 - 1.24 mg/dL   Calcium 9.4 8.9 - 09.6 mg/dL   Total Protein 7.2 6.5 - 8.1 g/dL   Albumin 4.3 3.5 - 5.0 g/dL   AST 39 15 - 41 U/L   ALT 133 (H) 17 - 63 U/L   Alkaline Phosphatase 339 (H) 38 - 126 U/L   Total Bilirubin 1.4 (H) 0.3 - 1.2 mg/dL   GFR calc non Af  Amer >60 >60 mL/min   GFR calc Af Amer >60 >60 mL/min   Anion gap 7 5 - 15  CBC     Status: Abnormal   Collection Time: 11/23/14  5:23 PM  Result Value Ref Range   WBC 11.4 (H) 4.0 - 10.5 K/uL   RBC 4.62 4.22 - 5.81 MIL/uL   Hemoglobin 15.2 13.0 - 17.0 g/dL   HCT 04.542.9 40.939.0 - 81.152.0 %   MCV 92.9 78.0 - 100.0 fL   MCH 32.9 26.0 - 34.0 pg   MCHC 35.4 30.0 - 36.0 g/dL   RDW 91.412.7 78.211.5 - 95.615.5 %   Platelets 269 150 - 400 K/uL  Urinalysis, Routine w reflex microscopic ([facility])     Status: None   Collection Time: 11/23/14  9:38 PM  Result Value Ref Range   Color, Urine YELLOW YELLOW   APPearance CLEAR CLEAR   Specific Gravity, Urine 1.016 1.005 - 1.030   pH 8.0 5.0 - 8.0   Glucose, UA NEGATIVE NEGATIVE mg/dL   Hgb urine dipstick NEGATIVE NEGATIVE   Bilirubin Urine NEGATIVE NEGATIVE   Ketones, ur NEGATIVE NEGATIVE mg/dL   Protein, ur NEGATIVE NEGATIVE mg/dL   Urobilinogen, UA 0.2 0.0 - 1.0 mg/dL   Nitrite NEGATIVE NEGATIVE   Leukocytes, UA NEGATIVE NEGATIVE    UA no evidence of UTI  No results found for: HGBA1C  CrCl cannot be calculated (Unknown ideal weight.).  BNP (last 3 results) No results for input(s): PROBNP in the last 8760 hours.  Other results:  I have pearsonaly reviewed this: ECG REPORT Not obtained  There were no vitals filed for this visit.   Cultures:    Component Value Date/Time   SDES WOUND DRAINAGE swab 09/11/2010 0010   SPECREQUEST right hand 09/11/2010 0010   CULT FEW AEROMONAS HYDROPHILA GROUP  09/11/2010 0010   REPTSTATUS 09/13/2010 FINAL 09/11/2010 0010     Radiological Exams on Admission: [place] Abdomen Limited Ruq  11/23/2014  CLINICAL DATA:  Pain EXAM: US ABDOMEN LIMITED - RIGHT UPPER QUADRANT COMPARISON:  None. FINDINGS: Gallbladder: Multiple layering small gallstones. 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 No pericholecystic fluid. Negative sonographic Murphy's sign. Common bile duct: Diameter: 9 mm, although smoothly tapering towards the ampulla. No choledocholithiasis is seen. Liver: No focal lesion identified. Within normal limits in parenchymal echogenicity. IMPRESSION: Cholelithiasis, without associated sonographic findings to suggest acute cholecystitis. Common duct is mildly prominent centrally, measuring 9 mm, smoothly tapering towards the ampulla. No choledocholithiasis is evident on MRI. Electronically Signed   By: Charline BillsSriyesh  Krishnan M.D.   On: 11/23/2014 21:34    Chart has been reviewed  Family not at Bedside   Assessment/Plan  54 year old gentleman with history of bipolar disorder presents with abdominal pain and was found to have elevated LFTs and evidence of cholelithiasis without evidence of cholecystitis Surgery Is Aware We'll see in  morning for now requesting medical admission for GI consult  Present on Admission:  . Cholecystitis, acute with cholelithiasis vs Biliary colic given continuous abdominal pain and unavailable cell count will cover with  Antibiotics. Will need GI consult in the morning. Surgery is  Aware, will repeat labs in AM follow bilirubin levels.  . Bipolar disorder, unspecified (HCC) continue home medications if able to tolerate      Prophylaxis: SCD    CODE STATUS:  FULL CODE  as per patient   Disposition: To home once workup is complete and patient is stable  Other plan as per orders.  I have spent a  total of 55 min on this admission  Venna Berberich 11/24/2014, 12:32 AM  [facility]  Pager [phone]   after 2 AM  please page floor coverage PA If 7AM-7PM, please contact the day team taking care of the patient  [email or web address]  Password [email or web address]

## 2014-11-24 NOTE — ED Notes (Signed)
Lab at bedside for collection

## 2014-11-24 NOTE — Progress Notes (Signed)
Clinical Social Work  CSW received referral for social work but "other" stated. Patient just returned from surgery and is unable to participate in assessment at this time. RN asked for CSW to follow up at later time but is unsure specifically why social work was consulted.   Unk LightningHolly Laquenta [place], [place] Weekend Coverage [phone]

## 2014-11-24 NOTE — Anesthesia Preprocedure Evaluation (Addendum)
Anesthesia Evaluation  Patient identified by MRN, date of birth, ID band Patient awake    Reviewed: Allergy & Precautions, H&P , NPO status , Patient's Chart, lab work & pertinent test results, reviewed documented beta blocker date and time   Airway Mallampati: II  TM Distance: >3 FB Neck ROM: full    Dental no notable dental hx. (+) Teeth Intact   Pulmonary neg pulmonary ROS,    Pulmonary exam normal        Cardiovascular negative cardio ROS Normal cardiovascular exam     Neuro/Psych PSYCHIATRIC DISORDERS Bipolar Disorder negative neurological ROS  negative psych ROS   GI/Hepatic negative GI ROS, Neg liver ROS,   Endo/Other  negative endocrine ROS  Renal/GU negative Renal ROS     Musculoskeletal   Abdominal Normal abdominal exam  (+)   Peds  Hematology negative hematology ROS (+)   Anesthesia Other Findings   Reproductive/Obstetrics negative OB ROS                           Anesthesia Physical Anesthesia Plan  ASA: II  Anesthesia Plan: General   Post-op Pain Management:    Induction: Intravenous  Airway Management Planned: Oral ETT  Additional Equipment:   Intra-op Plan:   Post-operative Plan: Extubation in OR  Informed Consent: I have reviewed the patients History and Physical, chart, labs and discussed the procedure including the risks, benefits and alternatives for the proposed anesthesia with the patient or authorized representative who has indicated his/her understanding and acceptance.   Dental advisory given  Plan Discussed with: CRNA and Surgeon  Anesthesia Plan Comments:       Anesthesia Quick Evaluation

## 2014-11-25 DIAGNOSIS — K8000 Calculus of gallbladder with acute cholecystitis without obstruction: Secondary | ICD-10-CM | POA: Diagnosis not present

## 2014-11-25 DIAGNOSIS — F319 Bipolar disorder, unspecified: Secondary | ICD-10-CM | POA: Diagnosis not present

## 2014-11-25 DIAGNOSIS — K8050 Calculus of bile duct without cholangitis or cholecystitis without obstruction: Secondary | ICD-10-CM | POA: Diagnosis not present

## 2014-11-25 LAB — COMPREHENSIVE METABOLIC PANEL
ALBUMIN: 3.1 g/dL — AB (ref 3.5–5.0)
ALK PHOS: 207 U/L — AB (ref 38–126)
ALT: 75 U/L — ABNORMAL HIGH (ref 17–63)
ANION GAP: 9 (ref 5–15)
AST: 37 U/L (ref 15–41)
BUN: 9 mg/dL (ref 6–20)
CALCIUM: 8.5 mg/dL — AB (ref 8.9–10.3)
CHLORIDE: 101 mmol/L (ref 101–111)
CO2: 26 mmol/L (ref 22–32)
Creatinine, Ser: 0.85 mg/dL (ref 0.61–1.24)
GFR calc non Af Amer: >60 mL/min (ref >60)
GLUCOSE: 83 mg/dL (ref 65–99)
POTASSIUM: 3.8 mmol/L (ref 3.5–5.1)
SODIUM: 136 mmol/L (ref 135–145)
Total Bilirubin: 1.3 mg/dL — ABNORMAL HIGH (ref 0.3–1.2)
Total Protein: 5.5 g/dL — ABNORMAL LOW (ref 6.5–8.1)

## 2014-11-25 LAB — CBC WITH DIFFERENTIAL/PLATELET
BASOS ABS: 0 10*3/uL (ref 0.0–0.1)
BASOS PCT: 0 %
EOS ABS: 0.1 10*3/uL (ref 0.0–0.7)
EOS PCT: 1 %
HCT: 37.7 % — ABNORMAL LOW (ref 39.0–52.0)
Hemoglobin: 13.2 g/dL (ref 13.0–17.0)
LYMPHS PCT: 15 %
Lymphs Abs: 1.3 10*3/uL (ref 0.7–4.0)
MCH: 33.1 pg (ref 26.0–34.0)
MCHC: 35 g/dL (ref 30.0–36.0)
MCV: 94.5 fL (ref 78.0–100.0)
MONO ABS: 1.3 10*3/uL — AB (ref 0.1–1.0)
Monocytes Relative: 15 %
NEUTROS ABS: 5.8 10*3/uL (ref 1.7–7.7)
NEUTROS PCT: 69 %
PLATELETS: 152 10*3/uL (ref 150–400)
RBC: 3.99 MIL/uL — ABNORMAL LOW (ref 4.22–5.81)
RDW: 12.8 % (ref 11.5–15.5)
WBC: 8.5 10*3/uL (ref 4.0–10.5)

## 2014-11-25 LAB — MAGNESIUM: MAGNESIUM: 1.9 mg/dL (ref 1.7–2.4)

## 2014-11-25 MED ORDER — SIMETHICONE 80 MG PO CHEW
160.0000 mg | CHEWABLE_TABLET | Freq: Four times a day (QID) | ORAL | Status: DC
  Administered 2014-11-25 – 2014-11-26 (×4): 160 mg via ORAL
  Filled 2014-11-25 (×7): qty 2

## 2014-11-25 NOTE — Progress Notes (Signed)
[facility] PROGRESS NOTE  Brendan [name] OZH:[id number] DOB: 1960-02-08 DOA: 11/23/2014 PCP: Eulis FosterPadonda B Webb, FNP  Assessment/Plan: #1 acute cholecystitis with cholelithiasis Patient status post laparoscopic cholecystectomy with intraoperative cholangiogram per Dr. Carolynne Edouardoth of general surgery 11/24/2014. Patient still with diffuse abdominal pain. Patient has been placed on simethicone. Pain management. Continue clear liquids. Per general surgery.  #2 bipolar disorder Stable. No suicidal or homicidal ideations. Continue home regimen of Depakote, Seroquel, Effexor  #3 prophylaxis PPI for GI prophylaxis. SCDs for DVT prophylaxis.  Code Status: Full Family Communication: Updated patient. No family at bedside. Disposition Plan: Home when medically stable and per general surgery.   Consultants:  Gen. surgery: Dr.Toth III 11/24/2014  Procedures:  Right upper quadrant abdominal ultrasound 11/23/2014  Laparoscopic cholecystectomy with intraoperative cholangiogram per Dr. Carolynne Edouardoth III 11/24/2014  Antibiotics:  IV Zosyn 11/24/2014  HPI/Subjective: Patient complaining of abdominal discomfort diet diffusely. Patient states some improvement of abdominal pain since admission and post surgery. No emesis.  Objective: Filed Vitals:   11/25/14 1100  BP:   Pulse: 86  Temp:   Resp:     Intake/Output Summary (Last 24 hours) at 11/25/14 1238 Last data filed at 11/25/14 1100  Gross per 24 hour  Intake   2540 ml  Output   1096 ml  Net   1444 ml   Filed Weights   11/24/14 0129  Weight: 88.1 kg (194 lb 3.6 oz)    Exam:   General:  NAD  Cardiovascular: RRR  Respiratory: CTAB  Abdomen: Mildly distended, diffuse tenderness to palpation, positive bowel sounds, no rebound, no guarding  Musculoskeletal: No clubbing cyanosis or edema.  Data Reviewed: Basic Metabolic Panel:  Recent Labs Lab 11/23/14 1723 11/24/14 0607 11/25/14 0605  NA 136 138 136  K 4.2 4.2 3.8  CL 100* 101  101  CO2 29 30 26   GLUCOSE 116* 116* 83  BUN 8 8 9   CREATININE 0.85 0.83 0.85  CALCIUM 9.4 8.9 8.5*  MG  --  1.9 1.9  PHOS  --  4.3  --    Liver Function Tests:  Recent Labs Lab 11/23/14 1723 11/24/14 0607 11/25/14 0605  AST 39 33 37  ALT 133* 99* 75*  ALKPHOS 339* 283* 207*  BILITOT 1.4* 1.2 1.3*  PROT 7.2 6.4* 5.5*  ALBUMIN 4.3 3.7 3.1*    Recent Labs Lab 11/23/14 1723  LIPASE 39   No results for input(s): AMMONIA in the last 168 hours. CBC:  Recent Labs Lab 11/23/14 1723 11/24/14 0607 11/25/14 0605  WBC 11.4* 13.5* 8.5  NEUTROABS  --   --  5.8  HGB 15.2 14.9 13.2  HCT 42.9 42.8 37.7*  MCV 92.9 93.9 94.5  PLT 269 262 152   Cardiac Enzymes: No results for input(s): CKTOTAL, CKMB, CKMBINDEX, TROPONINI in the last 168 hours. BNP (last 3 results) No results for input(s): BNP in the last 8760 hours.  ProBNP (last 3 results) No results for input(s): PROBNP in the last 8760 hours.  CBG: No results for input(s): GLUCAP in the last 168 hours.  Recent Results (from the past 240 hour(s))  Surgical pcr screen     Status: None   Collection Time: 11/24/14  8:30 AM  Result Value Ref Range Status   MRSA, PCR NEGATIVE NEGATIVE Final   Staphylococcus aureus NEGATIVE NEGATIVE Final    Comment:        The Xpert SA Assay (FDA approved for NASAL specimens in patients over 54 years of age), is one component  of a comprehensive surveillance program.  Test performance has been validated by [facility] for patients greater than or equal to 37 year old. It is not intended to diagnose infection nor to guide or monitor treatment.   Culture, blood (routine x 2)     Status: None (Preliminary result)   Collection Time: 11/24/14  9:10 AM  Result Value Ref Range Status   Specimen Description LEFT ANTECUBITAL  Final   Special Requests BOTTLES DRAWN AEROBIC AND ANAEROBIC 10CC  Final   Culture   Final    NO GROWTH < 24 HOURS Performed at [facility]    Report  Status PENDING  Incomplete  Culture, blood (routine x 2)     Status: None (Preliminary result)   Collection Time: 11/24/14  9:20 AM  Result Value Ref Range Status   Specimen Description LEFT ANTECUBITAL  Final   Special Requests BOTTLES DRAWN AEROBIC AND ANAEROBIC 10CC  Final   Culture   Final    NO GROWTH < 24 HOURS Performed at [facility]    Report Status PENDING  Incomplete     Studies: Dg Cholangiogram Operative  11/24/2014  CLINICAL DATA:  Gallstones. EXAM: INTRAOPERATIVE CHOLANGIOGRAM TECHNIQUE: Cholangiographic images from the C-arm fluoroscopic device were submitted for interpretation post-operatively. Please see the procedural report for the amount of contrast and the fluoroscopy time utilized. FLUOROSCOPY TIME:  12 seconds. COMPARISON:  Ultrasound of November 23, 2014. FINDINGS: Two cine sequences were submitted for review. These demonstrate contrast being injected into cannulated cystic duct remnant. Common bile duct is dilated, but no filling defects are noted. Antegrade flow into the duodenum is noted. IMPRESSION: No filling defects are seen in common bile duct suggest retained stones. Electronically Signed   By: Lupita Raider, M.D.   On: 11/24/2014 14:00   US Abdomen Limited Ruq  11/23/2014  CLINICAL DATA:  Pain EXAM: US ABDOMEN LIMITED - RIGHT UPPER QUADRANT COMPARISON:  None. FINDINGS: Gallbladder: Multiple layering small gallstones. 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 No pericholecystic fluid. Negative sonographic Murphy's sign. Common bile duct: Diameter: 9 mm, although smoothly tapering towards the ampulla. No choledocholithiasis is seen. Liver: No focal lesion identified. Within normal limits in parenchymal echogenicity. IMPRESSION: Cholelithiasis, without associated sonographic findings to suggest acute cholecystitis. Common duct is mildly prominent centrally, measuring 9 mm, smoothly tapering towards the ampulla. No choledocholithiasis is evident on MRI.  Electronically Signed   By: Charline Bills M.D.   On: 11/23/2014 21:34    Scheduled Meds: . divalproex  1,000 mg Oral QHS  . pantoprazole (PROTONIX) IV  40 mg Intravenous QHS  . piperacillin-tazobactam (ZOSYN)  IV  3.375 g Intravenous Q8H  . QUEtiapine  400 mg Oral QHS  . venlafaxine XR  300 mg Oral Daily   Continuous Infusions:   Principal Problem:   Cholecystitis, acute with cholelithiasis Active Problems:   Bipolar disorder, unspecified (HCC)   Biliary colic    Time spent: 57 MINS    [facility] MD [facility] Pager [phone]. If 7PM-7AM, please contact night-coverage at [email or web address], password [facility] 11/25/2014, 12:38 PM  LOS: 1 day

## 2014-11-25 NOTE — Progress Notes (Signed)
CSW received consult for "other" with out further information regarding referral. CSW asked attening, who also was unaware of the referral. CSW met with pt at bedside to assess. Pt stats he did not have any needs at this time. Pt states, they may have consulted, due to my history of mental illness. Pt shares that he is seen by Dr. Silvio Pate, and feels his medications are stable. Patient shares that he doesn't have much social support outside of his work at [employer]. No further [place] Work needs, signing off. Please consult CSW if further needs.   Belia Heman, LCSW  Clinical Social Work  Covering  [phone]

## 2014-11-26 ENCOUNTER — Encounter (HOSPITAL_COMMUNITY): Payer: Self-pay | Admitting: General Surgery

## 2014-11-26 DIAGNOSIS — K8050 Calculus of bile duct without cholangitis or cholecystitis without obstruction: Secondary | ICD-10-CM | POA: Diagnosis not present

## 2014-11-26 DIAGNOSIS — K8000 Calculus of gallbladder with acute cholecystitis without obstruction: Secondary | ICD-10-CM | POA: Diagnosis not present

## 2014-11-26 DIAGNOSIS — F319 Bipolar disorder, unspecified: Secondary | ICD-10-CM | POA: Diagnosis not present

## 2014-11-26 DIAGNOSIS — K8020 Calculus of gallbladder without cholecystitis without obstruction: Secondary | ICD-10-CM | POA: Diagnosis present

## 2014-11-26 LAB — HEPATIC FUNCTION PANEL
ALBUMIN: 3.1 g/dL — AB (ref 3.5–5.0)
ALT: 63 U/L (ref 17–63)
AST: 30 U/L (ref 15–41)
Alkaline Phosphatase: 219 U/L — ABNORMAL HIGH (ref 38–126)
BILIRUBIN DIRECT: 0.2 mg/dL (ref 0.1–0.5)
BILIRUBIN INDIRECT: 0.9 mg/dL (ref 0.3–0.9)
BILIRUBIN TOTAL: 1.1 mg/dL (ref 0.3–1.2)
Total Protein: 5.7 g/dL — ABNORMAL LOW (ref 6.5–8.1)

## 2014-11-26 LAB — BASIC METABOLIC PANEL
ANION GAP: 7 (ref 5–15)
BUN: 10 mg/dL (ref 6–20)
CALCIUM: 8.6 mg/dL — AB (ref 8.9–10.3)
CO2: 30 mmol/L (ref 22–32)
Chloride: 100 mmol/L — ABNORMAL LOW (ref 101–111)
Creatinine, Ser: 0.72 mg/dL (ref 0.61–1.24)
Glucose, Bld: 95 mg/dL (ref 65–99)
POTASSIUM: 3.6 mmol/L (ref 3.5–5.1)
SODIUM: 137 mmol/L (ref 135–145)

## 2014-11-26 LAB — CBC
HCT: 34.9 % — ABNORMAL LOW (ref 39.0–52.0)
Hemoglobin: 12.6 g/dL — ABNORMAL LOW (ref 13.0–17.0)
MCH: 33.7 pg (ref 26.0–34.0)
MCHC: 36.1 g/dL — ABNORMAL HIGH (ref 30.0–36.0)
MCV: 93.3 fL (ref 78.0–100.0)
PLATELETS: 189 10*3/uL (ref 150–400)
RBC: 3.74 MIL/uL — AB (ref 4.22–5.81)
RDW: 12.5 % (ref 11.5–15.5)
WBC: 5.8 10*3/uL (ref 4.0–10.5)

## 2014-11-26 LAB — MAGNESIUM: MAGNESIUM: 2 mg/dL (ref 1.7–2.4)

## 2014-11-26 MED ORDER — IBUPROFEN 200 MG PO TABS: 600.0000 mg | ORAL_TABLET | Freq: Four times a day (QID) | ORAL | Status: DC | PRN

## 2014-11-26 MED ORDER — HYDROCODONE-ACETAMINOPHEN 5-325 MG PO TABS: 1.0000 | ORAL_TABLET | ORAL | Status: AC | PRN

## 2014-11-26 NOTE — Discharge Instructions (Signed)
Laparoscopic Cholecystectomy, Care After °Refer to this sheet in the next few weeks. These instructions provide you with information about caring for yourself after your procedure. Your health care provider may also give you more specific instructions. Your treatment has been planned according to current medical practices, but problems sometimes occur. Call your health care provider if you have any problems or questions after your procedure. °WHAT TO EXPECT AFTER THE PROCEDURE °After your procedure, it is common to have: °· Pain at your incision sites. You will be given pain medicines to control your pain. °· Mild nausea or vomiting. This should improve after the first 24 hours. °· Bloating and possible shoulder pain from the gas that was used during the procedure. This will improve after the first 24 hours. °HOME CARE INSTRUCTIONS °Incision Care °· Follow instructions from your health care provider about how to take care of your incisions. Make sure you: °¨ Wash your hands with soap and water before you change your bandage (dressing). If soap and water are not available, use hand sanitizer. °¨ Change your dressing as told by your health care provider. °¨ Leave stitches (sutures), skin glue, or adhesive strips in place. These skin closures may need to be in place for 2 weeks or longer. If adhesive strip edges start to loosen and curl up, you may trim the loose edges. Do not remove adhesive strips completely unless your health care provider tells you to do that. °· Do not take baths, swim, or use a hot tub until your health care provider approves. Ask your health care provider if you can take showers. You may only be allowed to take sponge baths for bathing. °General Instructions °· Take over-the-counter and prescription medicines only as told by your health care provider. °· Do not drive or operate heavy machinery while taking prescription pain medicine. °· Return to your normal diet as told by your health care  provider. °· Do not lift anything that is heavier than 10 lb (4.5 kg). °· Do not play contact sports for one week or until your health care provider approves. °SEEK MEDICAL CARE IF:  °· You have redness, swelling, or pain at the site of your incision. °· You have fluid, blood, or pus coming from your incision. °· You notice a bad smell coming from your incision area. °· Your surgical incisions break open. °· You have a fever. °SEEK IMMEDIATE MEDICAL CARE IF: °· You develop a rash. °· You have difficulty breathing. °· You have chest pain. °· You have increasing pain in your shoulders (shoulder strap areas). °· You faint or have dizzy episodes while you are standing. °· You have severe pain in your abdomen. °· You have nausea or vomiting that lasts for more than one day. °  °This information is not intended to replace advice given to you by your health care provider. Make sure you discuss any questions you have with your health care provider. °  °Document Released: 01/19/2005 Document Revised: 10/10/2014 Document Reviewed: 08/31/2012 °Elsevier Interactive Patient Education ©2016 [employer] °CCS ______[facility] °LAPAROSCOPIC SURGERY: POST OP INSTRUCTIONS °Always review your discharge instruction sheet given to you by the facility where your surgery was performed. °IF YOU HAVE DISABILITY OR FAMILY LEAVE FORMS, YOU MUST BRING THEM TO THE OFFICE FOR PROCESSING.   °DO NOT GIVE THEM TO YOUR DOCTOR. ° °1. A prescription for pain medication may be given to you upon discharge.  Take your pain medication as prescribed, if needed.  If narcotic   pain medicine is not needed, then you may take acetaminophen (Tylenol) or ibuprofen (Advil) as needed. °2. Take your usually prescribed medications unless otherwise directed. °3. If you need a refill on your pain medication, please contact your pharmacy.  They will contact our office to request authorization. Prescriptions will not be filled after 5pm or on  week-ends. °4. You should follow a light diet the first few days after arrival home, such as soup and crackers, etc.  Be sure to include lots of fluids daily. °5. Most patients will experience some swelling and bruising in the area of the incisions.  Ice packs will help.  Swelling and bruising can take several days to resolve.  °6. It is common to experience some constipation if taking pain medication after surgery.  Increasing fluid intake and taking a stool softener (such as Colace) will usually help or prevent this problem from occurring.  A mild laxative (Milk of Magnesia or Miralax) should be taken according to package instructions if there are no bowel movements after 48 hours. °7. Unless discharge instructions indicate otherwise, you may remove your bandages 24-48 hours after surgery, and you may shower at that time.  You may have steri-strips (small skin tapes) in place directly over the incision.  These strips should be left on the skin for 7-10 days.  If your surgeon used skin glue on the incision, you may shower in 24 hours.  The glue will flake off over the next 2-3 weeks.  Any sutures or staples will be removed at the office during your follow-up visit. °8. ACTIVITIES:  You may resume regular (light) daily activities beginning the next day--such as daily self-care, walking, climbing stairs--gradually increasing activities as tolerated.  You may have sexual intercourse when it is comfortable.  Refrain from any heavy lifting or straining until approved by your doctor. °a. You may drive when you are no longer taking prescription pain medication, you can comfortably wear a seatbelt, and you can safely maneuver your car and apply brakes. °b. RETURN TO WORK:  __________________________________________________________ °9. You should see your doctor in the office for a follow-up appointment approximately 2-3 weeks after your surgery.  Make sure that you call for this appointment within a day or two after you  arrive home to insure a convenient appointment time. °10. OTHER INSTRUCTIONS: __________________________________________________________________________________________________________________________ __________________________________________________________________________________________________________________________ °WHEN TO CALL YOUR DOCTOR: °1. Fever over 101.0 °2. Inability to urinate °3. Continued bleeding from incision. °4. Increased pain, redness, or drainage from the incision. °5. Increasing abdominal pain ° °The clinic staff is available to answer your questions during regular business hours.  Please don’t hesitate to call and ask to speak to one of the nurses for clinical concerns.  If you have a medical emergency, go to the nearest emergency room or call 911.  A surgeon from [facility] is always on call at the hospital. °[address], [address], [place], [place]  [address] ? [address] [address], [place], [place]   [address] °[phone] ? [phone] ? FAX [phone] °Web site: [email or web address] ° °

## 2014-11-26 NOTE — Progress Notes (Signed)
2 Days Post-Op  Subjective: Rather flat affect, lives alone.  Drain is serous fluid.    Objective: Vital signs in last 24 hours: Temp:  [97.6 F (36.4 C)-98.2 F (36.8 C)] 97.6 F (36.4 C) (10/24 0610) Pulse Rate:  [74-86] 74 (10/24 0610) Resp:  [19-20] 19 (10/24 0610) BP: (126-127)/(67) 126/67 mmHg (10/24 0610) SpO2:  [96 %-98 %] 96 % (10/24 0610) Last BM Date: 11/23/14 (per pt) 480 PO Afebrile, VSS Labs OK IOC:  No filling defects are seen in common bile duct suggest retained Stones. Diet: full liquids   Intake/Output from previous day: 10/23 0701 - 10/24 0700 In: 480 [P.O.:480] Out: 121 [Urine:1; Drains:120] Intake/Output this shift:    General appearance: alert, cooperative and no distress GI: soft, sore, BS hypoactive, sites all look fine.  Lab Results:   Recent Labs  11/25/14 0605 11/26/14 0605  WBC 8.5 5.8  HGB 13.2 12.6*  HCT 37.7* 34.9*  PLT 152 189    BMET  Recent Labs  11/25/14 0605 11/26/14 0605  NA 136 137  K 3.8 3.6  CL 101 100*  CO2 26 30  GLUCOSE 83 95  BUN 9 10  CREATININE 0.85 0.72  CALCIUM 8.5* 8.6*   PT/INR No results for input(s): LABPROT, INR in the last 72 hours.   Recent Labs Lab 11/23/14 1723 11/24/14 0607 11/25/14 0605 11/26/14 0605  AST 39 33 37 30  ALT 133* 99* 75* 63  ALKPHOS 339* 283* 207* 219*  BILITOT 1.4* 1.2 1.3* 1.1  PROT 7.2 6.4* 5.5* 5.7*  ALBUMIN 4.3 3.7 3.1* 3.1*     Lipase     Component Value Date/Time   LIPASE 39 11/23/2014 1723     Studies/Results: Dg Cholangiogram Operative  11/24/2014  CLINICAL DATA:  Gallstones. EXAM: INTRAOPERATIVE CHOLANGIOGRAM TECHNIQUE: Cholangiographic images from the C-arm fluoroscopic device were submitted for interpretation post-operatively. Please see the procedural report for the amount of contrast and the fluoroscopy time utilized. FLUOROSCOPY TIME:  12 seconds. COMPARISON:  Ultrasound of November 23, 2014. FINDINGS: Two cine sequences were submitted for  review. These demonstrate contrast being injected into cannulated cystic duct remnant. Common bile duct is dilated, but no filling defects are noted. Antegrade flow into the duodenum is noted. IMPRESSION: No filling defects are seen in common bile duct suggest retained stones. Electronically Signed   By: Lupita RaiderJames  Green Jr, M.D.   On: 11/24/2014 14:00    Medications: . divalproex  1,000 mg Oral QHS  . pantoprazole (PROTONIX) IV  40 mg Intravenous QHS  . piperacillin-tazobactam (ZOSYN)  IV  3.375 g Intravenous Q8H  . QUEtiapine  400 mg Oral QHS  . simethicone  160 mg Oral QID  . venlafaxine XR  300 mg Oral Daily     Assessment/Plan Cholelithiasis with cholecystitis Bipolar disorder Antibiotics:  Day 4 Zosyn DVT:  SCD's only  Plan:  Stop Zosyn, ambulate, regular diet and see if he can go home today.  I will get his follow up information in the AVS. We can pull his drain today also.         LOS: 2 days    Danilynn Jemison 11/26/2014

## 2014-11-26 NOTE — Progress Notes (Signed)
11/26/14  1630  Reviewed discharge instructions with patient. Patient verbalized understanding of discharge instructions. Copy of prescription, discharge instructions, and work note given to patient.

## 2014-11-26 NOTE — Discharge Summary (Signed)
Physician Discharge Summary  Brendan [name] UJW:[id number] DOB: Nov 10, 1960 DOA: 11/23/2014  PCP: Eulis FosterPadonda B Webb, FNP  Admit date: 11/23/2014 Discharge date: 11/26/2014  Time spent: 60 minutes  Recommendations for Outpatient Follow-up:  1. Follow-up with general surgery as scheduled on 12/05/2014. On follow-up patient will likely need a comprehensive metabolic profile done to follow-up on electrolytes renal function and LFTs.  Discharge Diagnoses:  Principal Problem:   Cholecystitis, acute with cholelithiasis Active Problems:   Bipolar disorder, unspecified (HCC)   Biliary colic   Cholelithiasis   Discharge Condition: Stable and improved.  Diet recommendation: Soft diet.  Filed Weights   11/24/14 0129  Weight: 88.1 kg (194 lb 3.6 oz)    History of present illness:  Per Dr Sandi Mealyoutova Brendan [name] is a 54 y.o. male   has a past medical history of Bipolar 1 disorder (HCC) and Bipolar disorder (HCC).   Presented with upper abdominal pain that started on the morning of admission. He first presented his primary care provider was sent to emergency department where he received GI cocktail with no improvement. Patient has history of GERD and he had been having some intermittent nausea and diarrhea. Reported pain was better when he was still. Denied any fever. He's been reporting intermittent epigastric pain after eating but that had gotten bad on the morning of admission. The night before he ate lasagna. In the past he had significant pain with fatty foods.  Patient denied any prior surgeries. 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 Emergency labs was found to have WBC of 11.4 with evidence of elevated LFTs abdominal ultrasound was done showing cholelithiasis without associated scintigraphic findings to suggest acute cholecystitis but mildly prominent common duct lipase was within normal limits. Case was discussed with surgery by ER provider who recommended medical  admission for possible GI consult in AM they will see in AM as well.    Hospitalist was called for admission for abnormal LFTs and epigastric pain evidence of cholelithiasis being evaluated for possible early cholecystitis  Hospital Course:  #1 acute cholecystitis with cholelithiasis Patient had presented with epigastric abdominal pain in association with oral intake especially fatty foods with some intermittent nausea. On admission labs patient was noted to have elevated LFTs abdominal ultrasound which was done showed cholelithiasis without associated findings of acute cholecystitis but mildly prominent, bile duct. Patient was made nothing by mouth admitted and general surgical consultation was obtained. Patient was seen in consultation by Dr. Carolynne Edouardoth on 11/24/2014. Patient subsequently underwent laparoscopic cholecystectomy with intraoperative cholangiogram per Dr. Carolynne Edouardoth of general surgery 11/24/2014. Patient improved clinically he was subsequently started on clear liquids and diet advanced to a soft diet which he tolerated. Patient be discharged home in stable and improved condition and is to follow-up with general surgery as outpatient.   #2 bipolar disorder Stable. No suicidal or homicidal ideations. Continued on home regimen of Depakote, Seroquel, Effexor.   Procedures:  Right upper quadrant abdominal ultrasound 11/23/2014  Laparoscopic cholecystectomy with intraoperative cholangiogram per Dr. Carolynne Edouardoth III 11/24/2014    Consultations:  Gen. surgery: Dr.Toth III 11/24/2014  Discharge Exam: Filed Vitals:   11/26/14 1420  BP: 112/71  Pulse: 80  Temp: 98.5 F (36.9 C)  Resp: 20    General: NAD Cardiovascular: RRR Respiratory: CTAB Abdomen: Soft, less tender to palpation, positive bowel sounds, no rebound, no guarding.  Discharge Instructions   Discharge Instructions    Diet general    Complete by:  As directed  Soft diet     Discharge instructions    Complete by:  As  directed   Follow up with General surgery as scheduled.     Increase activity slowly    Complete by:  As directed           Current Discharge Medication List    START taking these medications   Details  HYDROcodone-acetaminophen (NORCO/VICODIN) 5-325 MG tablet Take 1-2 tablets by mouth every 4 (four) hours as needed for moderate pain. Qty: 15 tablet, Refills: 0      CONTINUE these medications which have NOT CHANGED   Details  divalproex (DEPAKOTE ER) 500 MG 24 hr tablet Take 2 tablets by mouth at bedtime.  Refills: 2    fluticasone (FLONASE) 50 MCG/ACT nasal spray Place 2 sprays into both nostrils daily as needed for allergies or rhinitis.  Refills: 11    Multiple Vitamin (MULTIVITAMIN WITH MINERALS) TABS tablet Take 1 tablet by mouth daily.    olopatadine (PATANOL) 0.1 % ophthalmic solution INSTILL 1 DROP INTO BOTH EYES TWICE DAILY AS NEEDED AT 6-8 HOUR INTERVALS. Refills: 5    omeprazole (PRILOSEC) 40 MG capsule Take 40 mg by mouth daily. Refills: 1    QUEtiapine (SEROQUEL) 400 MG tablet Take 400 mg by mouth at bedtime.    RaNITidine HCl (ZANTAC PO) Take 1 tablet by mouth 2 (two) times daily as needed (acid reflux).    venlafaxine XR (EFFEXOR-XR) 150 MG 24 hr capsule Take 300 mg by mouth daily with breakfast.       No Known Allergies Follow-up Information    Follow up with [facility] On 12/05/2014.   Specialty:  General Surgery   Why:  Your appointment is at 11:45 AM, be at the office 30 minutes early for check in.   Contact information:   [address] [place] [address] [phone]        The results of significant diagnostics from this hospitalization (including imaging, microbiology, ancillary and laboratory) are listed below for reference.    Significant Diagnostic Studies: Dg Cholangiogram Operative  11/24/2014  CLINICAL DATA:  Gallstones. EXAM: INTRAOPERATIVE CHOLANGIOGRAM TECHNIQUE: Cholangiographic images from the C-arm  fluoroscopic device were submitted for interpretation post-operatively. Please see the procedural report for the amount of contrast and the fluoroscopy time utilized. FLUOROSCOPY TIME:  12 seconds. COMPARISON:  Ultrasound of November 23, 2014. FINDINGS: Two cine sequences were submitted for review. These demonstrate contrast being injected into cannulated cystic duct remnant. Common bile duct is dilated, but no filling defects are noted. Antegrade flow into the duodenum is noted. IMPRESSION: No filling defects are seen in common bile duct suggest retained stones. Electronically Signed   By: Lupita Raider, M.D.   On: 11/24/2014 14:00   US Abdomen Limited Ruq  11/23/2014  CLINICAL DATA:  Pain EXAM: US ABDOMEN LIMITED - RIGHT UPPER QUADRANT COMPARISON:  None. FINDINGS: Gallbladder: Multiple layering small gallstones. 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 No pericholecystic fluid. Negative sonographic Murphy's sign. Common bile duct: Diameter: 9 mm, although smoothly tapering towards the ampulla. No choledocholithiasis is seen. Liver: No focal lesion identified. Within normal limits in parenchymal echogenicity. IMPRESSION: Cholelithiasis, without associated sonographic findings to suggest acute cholecystitis. Common duct is mildly prominent centrally, measuring 9 mm, smoothly tapering towards the ampulla. No choledocholithiasis is evident on MRI. Electronically Signed   By: Charline Bills M.D.   On: 11/23/2014 21:34    Microbiology: Recent Results (from the past 240 hour(s))  Surgical pcr screen  Status: None   Collection Time: 11/24/14  8:30 AM  Result Value Ref Range Status   MRSA, PCR NEGATIVE NEGATIVE Final   Staphylococcus aureus NEGATIVE NEGATIVE Final    Comment:        The Xpert SA Assay (FDA approved for NASAL specimens in patients over 59 years of age), is one component of a comprehensive surveillance program.  Test performance has been validated by [facility] for patients  greater than or equal to 60 year old. It is not intended to diagnose infection nor to guide or monitor treatment.   Culture, blood (routine x 2)     Status: None (Preliminary result)   Collection Time: 11/24/14  9:10 AM  Result Value Ref Range Status   Specimen Description LEFT ANTECUBITAL  Final   Special Requests BOTTLES DRAWN AEROBIC AND ANAEROBIC 10CC  Final   Culture   Final    NO GROWTH 2 DAYS Performed at [facility]    Report Status PENDING  Incomplete  Culture, blood (routine x 2)     Status: None (Preliminary result)   Collection Time: 11/24/14  9:20 AM  Result Value Ref Range Status   Specimen Description LEFT ANTECUBITAL  Final   Special Requests BOTTLES DRAWN AEROBIC AND ANAEROBIC 10CC  Final   Culture   Final    NO GROWTH 2 DAYS Performed at [facility]    Report Status PENDING  Incomplete     Labs: Basic Metabolic Panel:  Recent Labs Lab 11/23/14 1723 11/24/14 0607 11/25/14 0605 11/26/14 0605  NA 136 138 136 137  K 4.2 4.2 3.8 3.6  CL 100* 101 101 100*  CO2 GLUCOSE 116* 116* 83 95  BUN CREATININE 0.85 0.83 0.85 0.72  CALCIUM 9.4 8.9 8.5* 8.6*  MG  --  1.9 1.9 2.0  PHOS  --  4.3  --   --    Liver Function Tests:  Recent Labs Lab 11/23/14 1723 11/24/14 0607 11/25/14 0605 11/26/14 0605  AST 39 33 37 30  ALT 133* 99* 75* 63  ALKPHOS 339* 283* 207* 219*  BILITOT 1.4* 1.2 1.3* 1.1  PROT 7.2 6.4* 5.5* 5.7*  ALBUMIN 4.3 3.7 3.1* 3.1*    Recent Labs Lab 11/23/14 1723  LIPASE 39   No results for input(s): AMMONIA in the last 168 hours. CBC:  Recent Labs Lab 11/23/14 1723 11/24/14 0607 11/25/14 0605 11/26/14 0605  WBC 11.4* 13.5* 8.5 5.8  NEUTROABS  --   --  5.8  --   HGB 15.2 14.9 13.2 12.6*  HCT 42.9 42.8 37.7* 34.9*  MCV 92.9 93.9 94.5 93.3  PLT 269 262 152 189   Cardiac Enzymes: No results for input(s): CKTOTAL, CKMB, CKMBINDEX, TROPONINI in the last 168 hours. BNP: BNP (last 3  results) No results for input(s): BNP in the last 8760 hours.  ProBNP (last 3 results) No results for input(s): PROBNP in the last 8760 hours.  CBG: No results for input(s): GLUCAP in the last 168 hours.     SignedRamiro Harvest MD [facility] 11/26/2014, 3:21 PM

## 2014-11-29 LAB — CULTURE, BLOOD (ROUTINE X 2)
CULTURE: NO GROWTH
Culture: NO GROWTH

## 2015-09-19 ENCOUNTER — Encounter (HOSPITAL_BASED_OUTPATIENT_CLINIC_OR_DEPARTMENT_OTHER): Payer: Self-pay | Admitting: *Deleted

## 2015-09-19 DIAGNOSIS — S61412A Laceration without foreign body of left hand, initial encounter: Secondary | ICD-10-CM | POA: Insufficient documentation

## 2015-09-19 DIAGNOSIS — W268XXA Contact with other sharp object(s), not elsewhere classified, initial encounter: Secondary | ICD-10-CM | POA: Insufficient documentation

## 2015-09-19 DIAGNOSIS — Y939 Activity, unspecified: Secondary | ICD-10-CM | POA: Insufficient documentation

## 2015-09-19 DIAGNOSIS — Y990 Civilian activity done for income or pay: Secondary | ICD-10-CM | POA: Diagnosis not present

## 2015-09-19 DIAGNOSIS — S6992XA Unspecified injury of left wrist, hand and finger(s), initial encounter: Secondary | ICD-10-CM | POA: Diagnosis present

## 2015-09-19 DIAGNOSIS — Y929 Unspecified place or not applicable: Secondary | ICD-10-CM | POA: Diagnosis not present

## 2015-09-19 NOTE — ED Triage Notes (Signed)
Pt c/o laceration by plastic scraper x 1 hr

## 2015-09-20 ENCOUNTER — Emergency Department (HOSPITAL_BASED_OUTPATIENT_CLINIC_OR_DEPARTMENT_OTHER)
Admission: EM | Admit: 2015-09-20 | Discharge: 2015-09-20 | Disposition: A | Discharge status: Home / Self Care | Payer: Worker's Compensation | Attending: Emergency Medicine | Admitting: Emergency Medicine

## 2015-09-20 DIAGNOSIS — S61412A Laceration without foreign body of left hand, initial encounter: Secondary | ICD-10-CM

## 2015-09-20 MED ORDER — LIDOCAINE-EPINEPHRINE 2 %-1:100000 IJ SOLN
20.0000 mL | Freq: Once | INTRAMUSCULAR | Status: AC
  Administered 2015-09-20: 20 mL via INTRADERMAL
  Filled 2015-09-20: qty 1

## 2015-09-20 MED ORDER — CEPHALEXIN 250 MG PO CAPS
1000.0000 mg | ORAL_CAPSULE | Freq: Once | ORAL | Status: AC
  Administered 2015-09-20: 1000 mg via ORAL
  Filled 2015-09-20: qty 4

## 2015-09-20 MED ORDER — CEPHALEXIN 500 MG PO CAPS: 500.0000 mg | ORAL_CAPSULE | Freq: Four times a day (QID) | ORAL | 0 refills | Status: DC

## 2015-09-20 NOTE — ED Provider Notes (Signed)
[facility]   CSN: [id number] Arrival date & time: 09/19/15  2305     History   Chief Complaint Chief Complaint  Patient presents with  . Laceration    HPI Brendan [name] is a 55 y.o. male who cut his left thenar eminence with a plastic scraper at work just prior to arrival. There is moderate pain associated, worse with movement or palpation. There is no functional or sensory deficit associated. He denies other injury. He states his tetanus is up-to-date. Bleeding has been controlled with pressure. The wound was well irrigated prior to my evaluation.  HPI  Past Medical History:  Diagnosis Date  . Bipolar 1 disorder (HCC)   . Bipolar disorder [facility](HCC)     Patient Active Problem List   Diagnosis Date Noted  . Cholelithiasis 11/26/2014  . Cholecystitis, acute with cholelithiasis 11/24/2014  . Biliary colic 11/24/2014  . Cholecystitis   . Right upper quadrant pain   . Bipolar disorder, unspecified (HCC) 04/27/2013    Past Surgical History:  Procedure Laterality Date  . CHOLECYSTECTOMY N/A 11/24/2014   Procedure: LAPAROSCOPIC CHOLECYSTECTOMY WITH INTRAOPERATIVE CHOLANGIOGRAM;  Surgeon: Chevis PrettyPaul Toth III, MD;  Location: [facility];  Service: General;  Laterality: N/A;  . FEMUR SURGERY    . TONSILLECTOMY         Home Medications    Prior to Admission medications   Medication Sig Start Date End Date Taking? Authorizing Provider  divalproex (DEPAKOTE ER) 500 MG 24 hr tablet Take 2 tablets by mouth at bedtime.  09/21/14   Historical Provider, MD  fluticasone (FLONASE) 50 MCG/ACT nasal spray Place 2 sprays into both nostrils daily as needed for allergies or rhinitis.  09/15/14   Historical Provider, MD  HYDROcodone-acetaminophen (NORCO/VICODIN) 5-325 MG tablet Take 1-2 tablets by mouth every 4 (four) hours as needed for moderate pain. 11/26/14   Rodolph Bonganiel V Thompson, MD  Multiple Vitamin (MULTIVITAMIN WITH MINERALS) TABS tablet Take 1 tablet by mouth daily.     Historical Provider, MD  QUEtiapine (SEROQUEL) 400 MG tablet Take 400 mg by mouth at bedtime.    Historical Provider, MD  venlafaxine XR (EFFEXOR-XR) 150 MG 24 hr capsule Take 300 mg by mouth daily with breakfast.    Historical Provider, MD    Family History Family History  Problem Relation Age of Onset  . Hypertension Mother   . Pancreatitis Mother   . Colon polyps Father   . Stroke Father   . Hypertension Other   . CAD Other   . Dementia Other     Social History Social History  Substance Use Topics  . Smoking status: Never Smoker  . Smokeless tobacco: Not on file  . Alcohol use No     Allergies   Review of patient's allergies indicates no known allergies.   Review of Systems Review of Systems  All other systems reviewed and are negative.    Physical Exam Updated Vital Signs BP 131/96 (BP Location: Left Arm)   Pulse 79   Temp 98.2 F (36.8 C) (Oral)   Resp 16   Ht 6' (1.829 m)   Wt 190 lb (86.2 kg)   SpO2 95%   BMI 25.77 kg/m   Physical Exam General: Well-developed, well-nourished male in no acute distress; appearance consistent with age of record HENT: normocephalic; atraumatic Eyes: Normal appearance Neck: supple Heart: regular rate and rhythm Lungs: Normal respiratory effort and excursion Abdomen: soft; nondistended Extremities: No deformity; full range of motion; laceration of left thenar  eminence without motor or sensory deficit Neurologic: Awake, alert and oriented; motor function intact in all extremities and symmetric; no facial droop Skin: Warm and dry Psychiatric: Normal mood and affect    ED Treatments / Results   LACERATION REPAIR Performed by: Brittnae Aschenbrenner L Authorized by: Hanley SeamenMOLPUS,Ilyaas Musto L Consent: Verbal consent obtained. Risks and benefits: risks, benefits and alternatives were discussed Consent given by: patient Patient identity confirmed: provided demographic data Prepped and Draped in normal sterile fashion Wound  explored  Laceration Location: Left thenar eminence  Laceration Length: 2.5 cm  No Foreign Bodies seen or palpated  Anesthesia: local infiltration  Local anesthetic: lidocaine 2 % with epinephrine  Anesthetic total: 2 ml  Irrigation method: syringe Amount of cleaning: standard  Skin closure: 4-0 nylon   Number of sutures: 3   Technique: Simple interrupted   Patient tolerance: Patient tolerated the procedure well with no immediate complications.   Procedures (including critical care time)   Final Clinical Impressions(s) / ED Diagnoses   Final diagnoses:  Hand laceration, left, initial encounter      Paula LibraJohn Daiana Vitiello, MD 09/20/15 [id number]

## 2015-12-03 IMAGING — US US ABDOMEN LIMITED
1 series · 14 of 25 positions shown · non-contrast
Comparison: None.

CLINICAL DATA: Pain

EXAM:
US ABDOMEN LIMITED - RIGHT UPPER QUADRANT

[Series 1: us abdomen limited · 0.18mm/px · 14 of 61 slices shown]
[im 1/61]
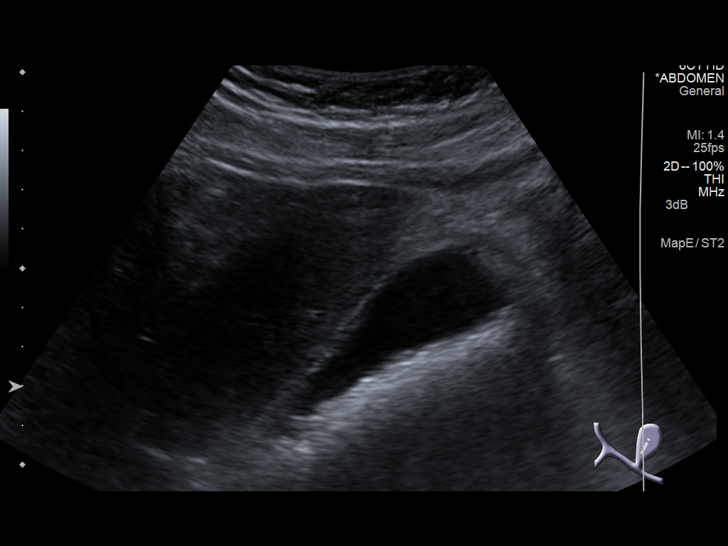
[im 6/61]
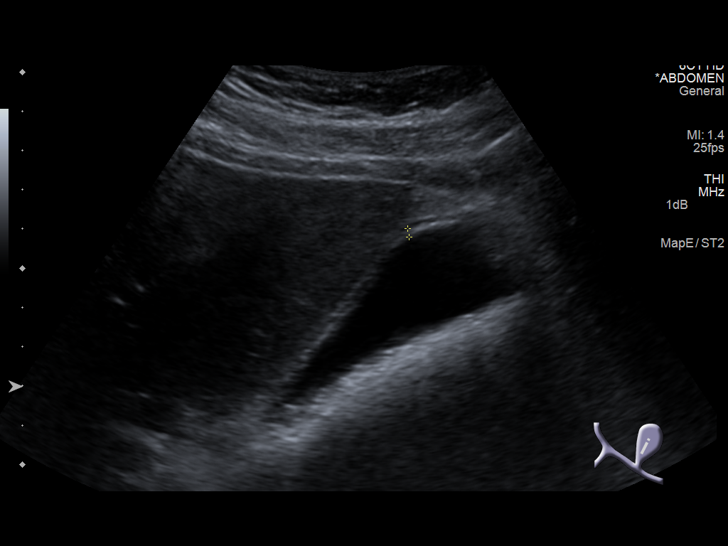
[im 11/61]
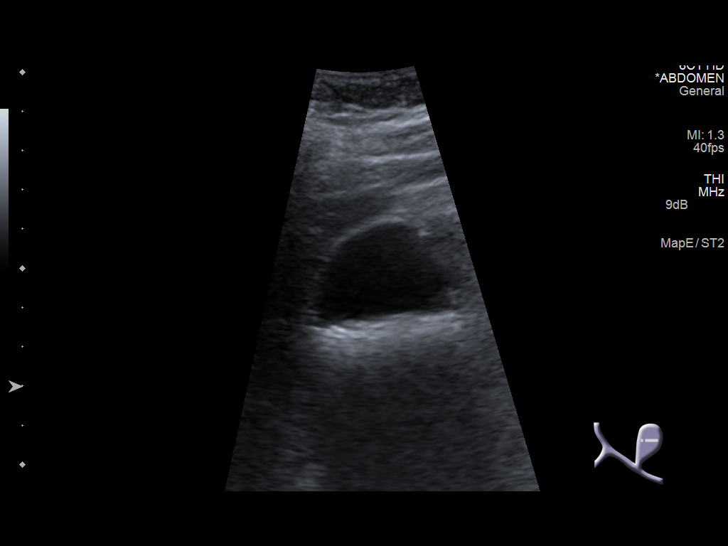
[im 16/61]
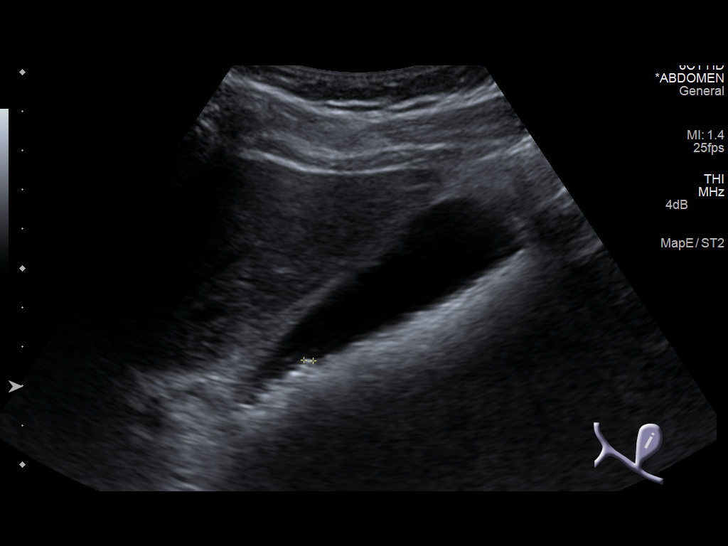
[im 21/61]
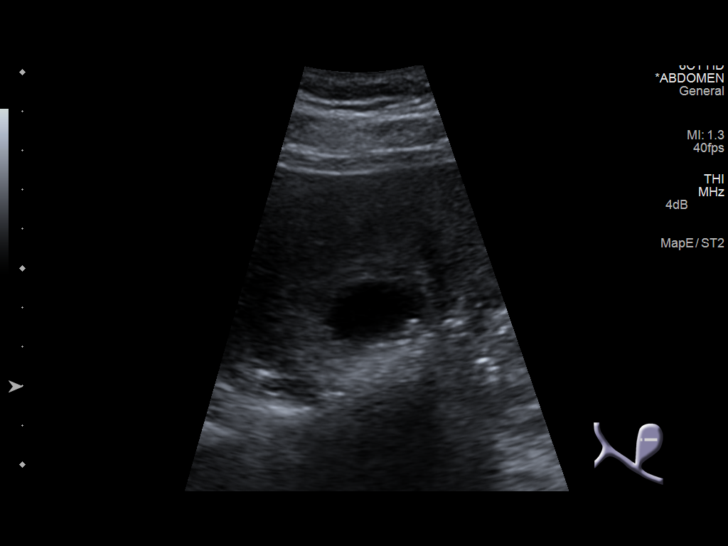
[im 23/61]
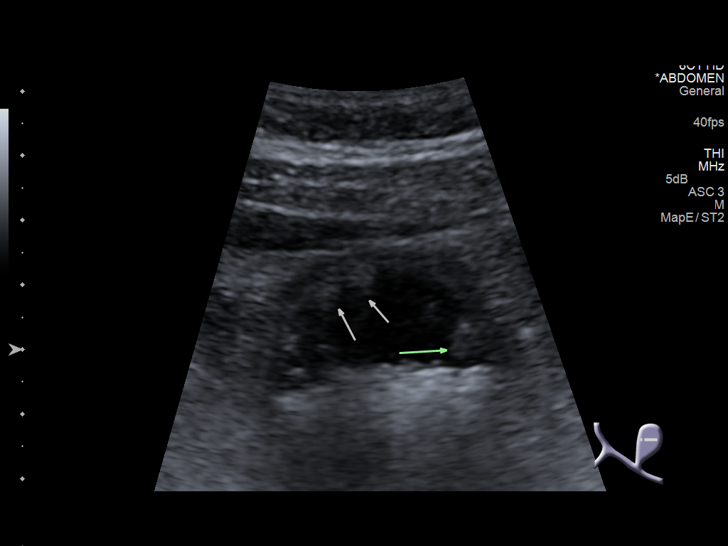
[im 28/61]
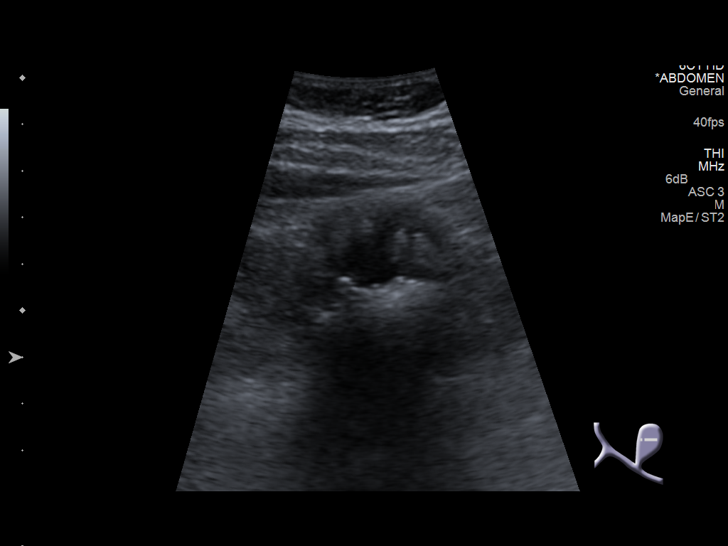
[im 33/61]
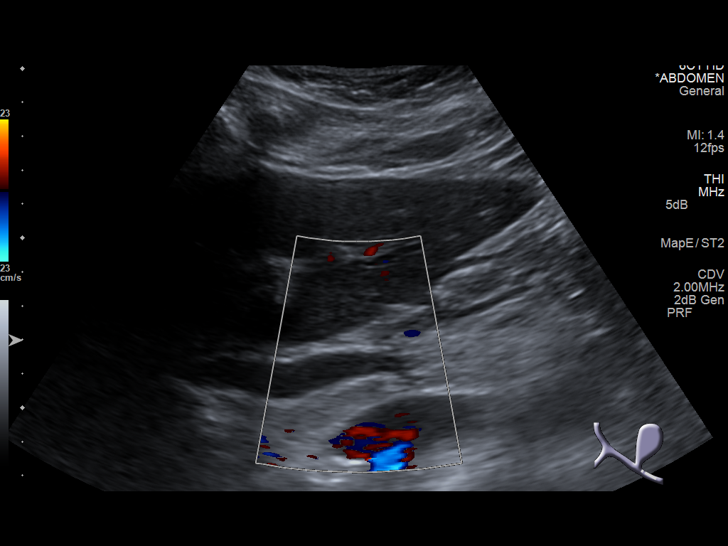
[im 38/61]
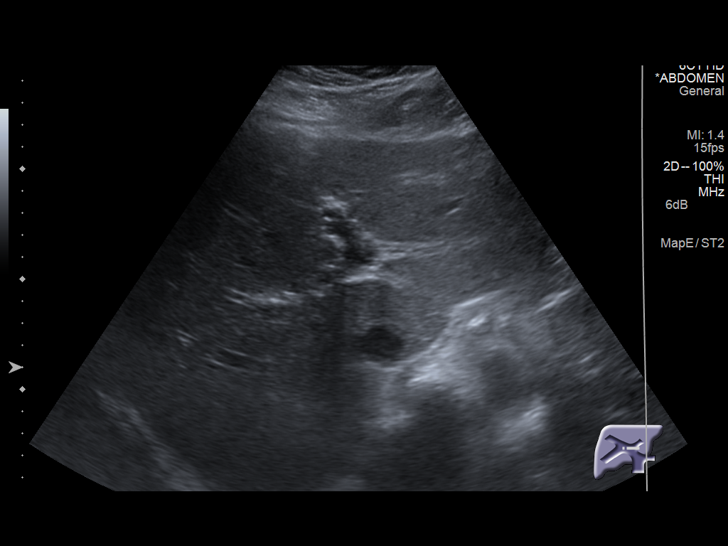
[im 41/61]
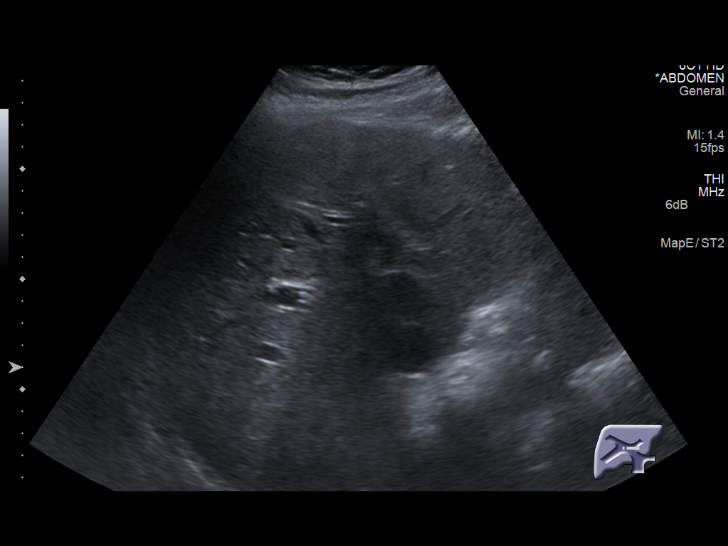
[im 46/61]
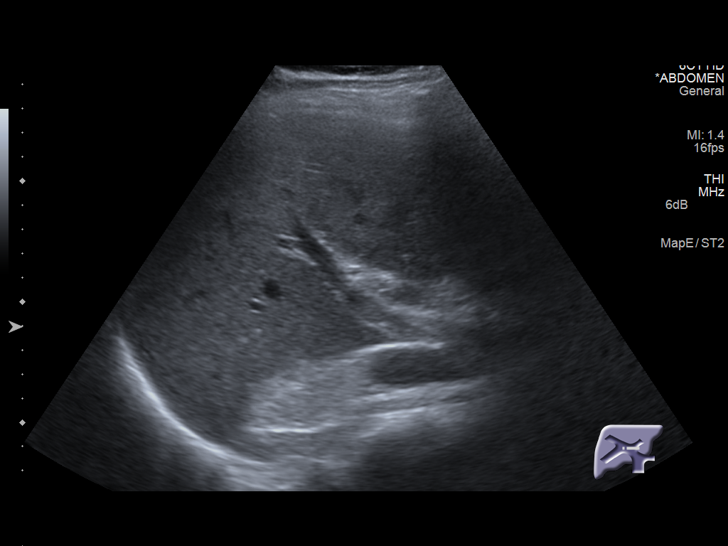
[im 51/61]
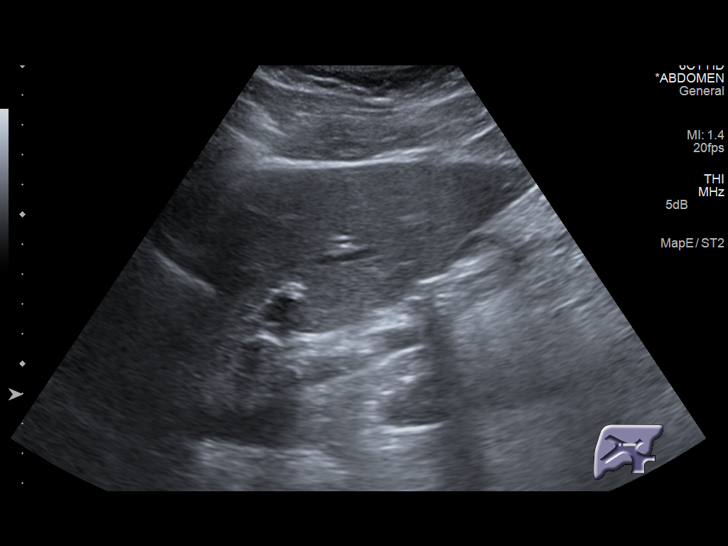
[im 56/61]
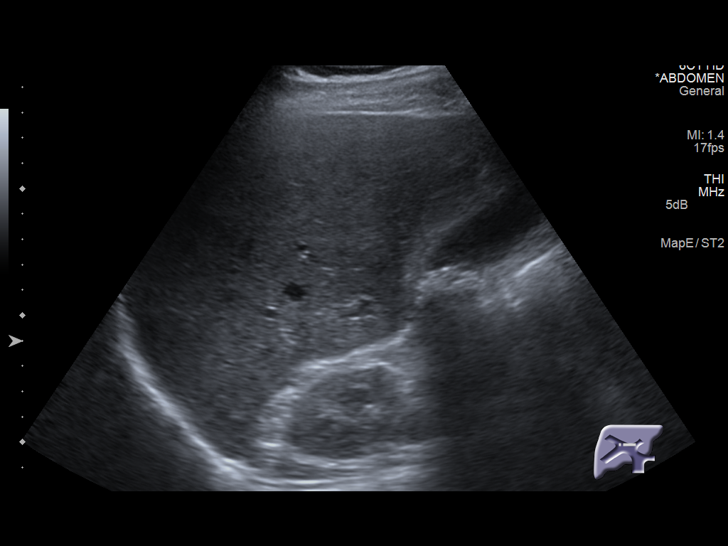
[im 61/61]
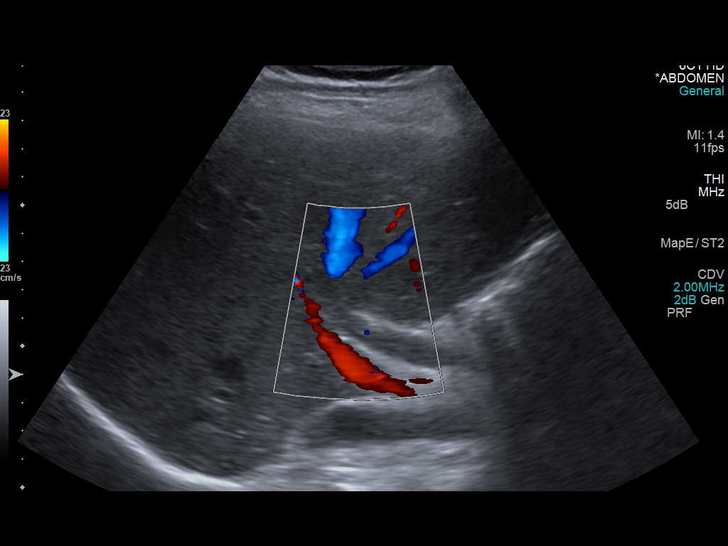

[14 of 25 positions shown; findings below may reference images not displayed]

FINDINGS: Gallbladder:

Multiple layering small gallstones. Suspected gallbladder
adenomyomatosis. No pericholecystic fluid. Negative sonographic
Murphy's sign.

Common bile duct:

Diameter: 9 mm, although smoothly tapering towards the ampulla. No
choledocholithiasis is seen.

Liver:

No focal lesion identified. Within normal limits in parenchymal
echogenicity.
IMPRESSION: Cholelithiasis, without associated sonographic findings to suggest
acute cholecystitis.

Common duct is mildly prominent centrally, measuring 9 mm, smoothly
tapering towards the ampulla. No choledocholithiasis is evident on
MRI.

## 2016-06-18 ENCOUNTER — Ambulatory Visit (INDEPENDENT_AMBULATORY_CARE_PROVIDER_SITE_OTHER): Payer: 59 | Admitting: Family Medicine

## 2016-06-18 ENCOUNTER — Encounter: Payer: Self-pay | Admitting: Family Medicine

## 2016-06-18 VITALS — BP 114/86 | HR 74 | Temp 98.0°F | Ht 72.0 in | Wt 198.2 lb

## 2016-06-18 DIAGNOSIS — Z7689 Persons encountering health services in other specified circumstances: Secondary | ICD-10-CM

## 2016-06-18 DIAGNOSIS — F319 Bipolar disorder, unspecified: Secondary | ICD-10-CM

## 2016-06-18 DIAGNOSIS — N4889 Other specified disorders of penis: Secondary | ICD-10-CM | POA: Diagnosis not present

## 2016-06-18 MED ORDER — CEFTRIAXONE SODIUM 500 MG IJ SOLR
250.0000 mg | Freq: Once | INTRAMUSCULAR | Status: AC
  Administered 2016-06-18: 250 mg via INTRAMUSCULAR

## 2016-06-18 NOTE — Progress Notes (Signed)
Patient ID: Brendan [name], male   DOB: August 08, 1960, 56 y.o.   MRN: [id number]  Patient presents to clinic today to establish care.  Acute Concerns: Sore on penis noted one week ago. Associated burning noted. He states that sore started out as a red bump turned white or had fluid in it and is now like an ulcer. He denies having intercourse in over one year but states he had episodes of "questionable intercourse" with a "possible" escort over 18 months ago who may have had herpes lesion. He denies drainage, erythema, fever, chills, sweats, N/VD. No treatments have been tried at this time. He states that the sore is improving. No aggravating factors noted. He denies dysuria, urgency, frequency, hematuria, or penile discharge. Partners: He reported one male partner as stated above Practices: penis/vaginal and oral sex Protection: He does not consistently use condoms Past History of STI: He denies previous history and states he has been testing recently.   Chronic Issues: Bipolar disorder: He is followed by psychiatrist and he was evaluated today; refills provided and lab work ordered. Psychiatrist has ordered depakote level, CMP, Lipid panel, and A1C.  Health Maintenance: Dental --Twice yearly Vision --Once yearly; wears glasses Immunizations --Influenza vaccine Fall 2017; he had a Tdap 2 years ago Colonoscopy -- Two years ago per patient. Follow up recommended in 5 years.    Past Medical History:  Diagnosis Date  . Bipolar 1 disorder (HCC)   . Bipolar disorder (HCC)   . Bipolar disorder (manic depression) (HCC) 1990    Past Surgical History:  Procedure Laterality Date  . CHOLECYSTECTOMY N/A 11/24/2014   Procedure: LAPAROSCOPIC CHOLECYSTECTOMY WITH INTRAOPERATIVE CHOLANGIOGRAM;  Surgeon: Chevis Pretty III, MD;  Location: [facility];  Service: General;  Laterality: N/A;  . FEMUR SURGERY    . TONSILLECTOMY      Current Outpatient Prescriptions on File Prior to Visit  Medication Sig Dispense  Refill  . divalproex (DEPAKOTE ER) 500 MG 24 hr tablet Take 2 tablets by mouth at bedtime.   2  . Multiple Vitamin (MULTIVITAMIN WITH MINERALS) TABS tablet Take 1 tablet by mouth daily.    . QUEtiapine (SEROQUEL) 400 MG tablet Take 400 mg by mouth at bedtime.    Marland Kitchen venlafaxine XR (EFFEXOR-XR) 150 MG 24 hr capsule Take 300 mg by mouth daily with breakfast.    . fluticasone (FLONASE) 50 MCG/ACT nasal spray Place 2 sprays into both nostrils daily as needed for allergies or rhinitis.   11  . HYDROcodone-acetaminophen (NORCO/VICODIN) 5-325 MG tablet Take 1-2 tablets by mouth every 4 (four) hours as needed for moderate pain. (Patient not taking: Reported on 06/18/2016) 15 tablet 0   No current facility-administered medications on file prior to visit.     No Known Allergies  Family History  Problem Relation Age of Onset  . Hypertension Mother   . Pancreatitis Mother   . Colon polyps Father   . Stroke Father   . Hypertension Other   . CAD Other   . Dementia Other     Social History   Social History  . Marital status: Divorced    Spouse name: N/A  . Number of children: N/A  . Years of education: N/A   Occupational History  . Not on file.   Social History Main Topics  . Smoking status: Former Games developer  . Smokeless tobacco: Former Neurosurgeon  . Alcohol use No  . Drug use: No  . Sexual activity: Not on file   Other Topics Concern  .  Not on file   Social History Narrative  . No narrative on file    Review of Systems  Constitutional: Negative for chills, fever and malaise/fatigue.  HENT: Negative for congestion and sore throat.   Respiratory: Negative for cough, hemoptysis, sputum production, shortness of breath and wheezing.   Cardiovascular: Negative for chest pain, palpitations and leg swelling.  Gastrointestinal: Negative for abdominal pain, blood in stool, constipation, diarrhea, heartburn, nausea and vomiting.  Genitourinary: Negative for dysuria, frequency, hematuria and  urgency.       Sore on penis  Musculoskeletal: Negative for myalgias.  Skin: Negative for rash.  Neurological: Negative for dizziness and headaches.  Psychiatric/Behavioral:       Denies depressed or anxious mood. History of bipolar disorder    BP 114/86 (BP Location: Left Arm, Patient Position: Sitting, Cuff Size: Normal)   Pulse 74   Temp 98 F (36.7 C) (Oral)   Ht 6' (1.829 m)   Wt 198 lb 3.2 oz (89.9 kg)   SpO2 97%   BMI 26.88 kg/m   Physical Exam  Constitutional: He is oriented to person, place, and time and well-developed, well-nourished, and in no distress.  Eyes: Pupils are equal, round, and reactive to light. No scleral icterus.  Neck: Neck supple.  Cardiovascular: Normal rate and regular rhythm.   Pulmonary/Chest: Effort normal and breath sounds normal. He has no wheezes. He has no rales.  Abdominal: Soft. Bowel sounds are normal. There is no tenderness.  Genitourinary:  Genitourinary Comments: 2 mm lesion (ulcer) on penis that appears to be healing. Ulceration with erythematous base. History per patient does not clearly indicate if this was previously vesicular in nature. No drainage or erythema noted. No other lesions noted in genital area  Musculoskeletal: Normal range of motion.  Lymphadenopathy:    He has no cervical adenopathy.  Neurological: He is alert and oriented to person, place, and time.  Skin: Skin is warm and dry. No rash noted.  Psychiatric: Mood, memory and judgment normal.   Past Medical History:  Diagnosis Date  . Bipolar 1 disorder (HCC)   . Bipolar disorder (HCC)   . Bipolar disorder (manic depression) (HCC) 1990     Social History   Social History  . Marital status: Divorced    Spouse name: N/A  . Number of children: N/A  . Years of education: N/A   Occupational History  . Not on file.   Social History Main Topics  . Smoking status: Former Smoker    Types: Cigarettes    Quit date: 02/02/2014  . Smokeless tobacco: Former Neurosurgeon     Quit date: 12/04/2015  . Alcohol use No     Comment: Recovered alcoholic  . Drug use: No  . Sexual activity: Not Currently   Other Topics Concern  . Not on file   Social History Narrative  . No narrative on file    Past Surgical History:  Procedure Laterality Date  . CHOLECYSTECTOMY N/A 11/24/2014   Procedure: LAPAROSCOPIC CHOLECYSTECTOMY WITH INTRAOPERATIVE CHOLANGIOGRAM;  Surgeon: Chevis Pretty III, MD;  Location: [facility];  Service: General;  Laterality: N/A;  . FEMUR SURGERY    . TONSILLECTOMY      Family History  Problem Relation Age of Onset  . Hypertension Mother   . Pancreatitis Mother   . Colon polyps Father   . Stroke Father   . Hypertension Other   . CAD Other   . Dementia Other     No Known Allergies  Current  Outpatient Prescriptions on File Prior to Visit  Medication Sig Dispense Refill  . divalproex (DEPAKOTE ER) 500 MG 24 hr tablet Take 2 tablets by mouth at bedtime.   2  . Multiple Vitamin (MULTIVITAMIN WITH MINERALS) TABS tablet Take 1 tablet by mouth daily.    . QUEtiapine (SEROQUEL) 400 MG tablet Take 400 mg by mouth at bedtime.    Marland Kitchen. venlafaxine XR (EFFEXOR-XR) 150 MG 24 hr capsule Take 300 mg by mouth daily with breakfast.    . fluticasone (FLONASE) 50 MCG/ACT nasal spray Place 2 sprays into both nostrils daily as needed for allergies or rhinitis.   11  . HYDROcodone-acetaminophen (NORCO/VICODIN) 5-325 MG tablet Take 1-2 tablets by mouth every 4 (four) hours as needed for moderate pain. (Patient not taking: Reported on 06/18/2016) 15 tablet 0   No current facility-administered medications on file prior to visit.     BP 114/86 (BP Location: Left Arm, Patient Position: Sitting, Cuff Size: Normal)   Pulse 74   Temp 98 F (36.7 C) (Oral)   Ht 6' (1.829 m)   Wt 198 lb 3.2 oz (89.9 kg)   SpO2 97%   BMI 26.88 kg/m    Assessment/Plan: 1. Sore of penis Appearance of sore and history are concerning for chancroid vs.herpes.  With history will treat for  chancroid with ceftriaxone 250 mg IM and discussed safe sex practices and also screening for STIs.Highly recommended STI screening today and patient declined stating he will obtain his records as he has been previously tested for STIs and then will consider testing at his next visit.  Recommendations for screening have been strongly recommended and physical examination is recommended with lab work at his earliest convenience.  2. Bipolar 1 disorder (HCC) Follow up with psychiatrist as discussed and obtain lab work that has been ordered by his psychiatrist.  3. Encounter to establish care We reviewed the PMH, PSH, FH, SH, Meds and Allergies. -We provided refills for any medications we will prescribe as needed. -We addressed current concerns per orders and patient instructions. -We have asked for records for pertinent exams, studies, vaccines and notes from previous providers. -We have advised patient to follow up per instructions below.   -Patient advised to return or notify a provider immediately if symptoms worsen or persist or new concerns arise.  I spent 25 minutes with this patient and greater than 50% of the time was spent in face to face counseling regarding safe sex practices and the importance of screening and treatment.  Roddie McJulia Larinda Herter, FNP-C

## 2016-06-18 NOTE — Patient Instructions (Addendum)
It was a pleasure to see you today. Please keep appointment with urologist that you mentioned. Also, please schedule a physical when you have an opportunity. A form is available for you to have your other medical records sent to this office.

## 2016-10-22 ENCOUNTER — Encounter: Payer: Self-pay | Admitting: Family Medicine

## 2016-11-12 ENCOUNTER — Ambulatory Visit: Payer: Self-pay

## 2016-11-16 ENCOUNTER — Ambulatory Visit: Payer: 59 | Admitting: Family Medicine

## 2016-11-20 ENCOUNTER — Ambulatory Visit: Payer: 59 | Admitting: Family Medicine

## 2016-11-26 ENCOUNTER — Ambulatory Visit (INDEPENDENT_AMBULATORY_CARE_PROVIDER_SITE_OTHER): Payer: 59 | Admitting: Orthopedic Surgery

## 2016-11-26 DIAGNOSIS — M722 Plantar fascial fibromatosis: Secondary | ICD-10-CM

## 2016-11-26 MED ORDER — METHYLPREDNISOLONE ACETATE 40 MG/ML IJ SUSP
40.0000 mg | INTRAMUSCULAR | Status: AC | PRN
  Administered 2016-11-26: 40 mg

## 2016-11-26 MED ORDER — LIDOCAINE HCL 1 % IJ SOLN
2.0000 mL | INTRAMUSCULAR | Status: AC | PRN
  Administered 2016-11-26: 2 mL

## 2016-11-26 NOTE — Progress Notes (Signed)
Office Visit Note   Patient: Brendan [name]           Date of Birth: August 04, 1960           MRN: [id number] Visit Date: 11/26/2016              Requested by: Roddie McKordsmeier, Julia, FNP [address] [place], [place] [id number] PCP: Roddie McKordsmeier, Julia, FNP  No chief complaint on file.     HPI: The patient is a 56 year old gentleman seen today for initial evaluation of left heel pain, plantar aspect. Has been ongoing for about 2 months. Wonders if is plantar fasciitis. Has had pain with start up. No relief with ibu or aleve.   Assessment & Plan: Visit Diagnoses:  1. Plantar fasciitis     Plan: Depomedrol injection left heel. Recommended new balance or brooks walking shoes. Stressed importance of arch supports. Follow up as needed.  Follow-Up Instructions: Return in about 4 weeks (around 12/24/2016), or if symptoms worsen or fail to improve.   Ortho Exam  Patient is alert, oriented, no adenopathy, well-dressed, normal affect, normal respiratory effort. On examination of left foot. Foot is plantigrade tender to origin of plantar fascia on left. No pain with lateral compression of calcaneous. Achilles nontender. Does have some heel cord tightness with dorsiflexion to neutral.  Imaging: No results found. No images are attached to the encounter.  Labs: Lab Results  Component Value Date   REPTSTATUS 11/29/2014 FINAL 11/24/2014   GRAMSTAIN  09/11/2010    MODERATE WBC PRESENT, PREDOMINANTLY PMN NO SQUAMOUS EPITHELIAL CELLS SEEN NO ORGANISMS SEEN   CULT  11/24/2014    NO GROWTH 5 DAYS [facility]    [facility] 09/11/2010    Orders:  No orders of the defined types were placed in this encounter.  No orders of the defined types were placed in this encounter.    Procedures: Foot Inj Date/Time: 11/26/2016 1:11 PM Performed by: Barnie DelZAMORA, ERIN R Authorized by: Barnie DelZAMORA, ERIN R   Consent Given by:  Patient Site marked: the  procedure site was marked   Timeout: prior to procedure the correct patient, procedure, and site was verified   Indications:  Fasciitis and pain Condition: Plantar Fasciitis   Location: left plantar fascia muscle   Prep: patient was prepped and draped in usual sterile fashion   Needle Size:  22 G Medications:  2 mL lidocaine 1 %; 40 mg methylPREDNISolone acetate 40 MG/ML Patient Tolerance:  Patient tolerated the procedure well with no immediate complications    Clinical Data: No additional findings.  ROS:  All other systems negative, except as noted in the HPI. Review of Systems  Constitutional: Negative for chills and fever.  Musculoskeletal: Positive for myalgias.  Skin: Negative for color change.    Objective: Vital Signs: There were no vitals taken for this visit.  Specialty Comments:  No specialty comments available.  PMFS History: Patient Active Problem List   Diagnosis Date Noted  . Bipolar disorder, unspecified (HCC) 04/27/2013   Past Medical History:  Diagnosis Date  . Bipolar 1 disorder (HCC)   . Bipolar disorder (HCC)   . Bipolar disorder (manic depression) (HCC) 1990    Family History  Problem Relation Age of Onset  . Hypertension Mother   . Pancreatitis Mother   . Colon polyps Father   . Stroke Father   . Hypertension Other   . CAD Other   . Dementia Other  Past Surgical History:  Procedure Laterality Date  . CHOLECYSTECTOMY N/A 11/24/2014   Procedure: LAPAROSCOPIC CHOLECYSTECTOMY WITH INTRAOPERATIVE CHOLANGIOGRAM;  Surgeon: Chevis Pretty III, MD;  Location: [facility];  Service: General;  Laterality: N/A;  . FEMUR SURGERY    . TONSILLECTOMY     Social History   Occupational History  . Not on file.   Social History Main Topics  . Smoking status: Former Smoker    Types: Cigarettes    Quit date: 02/02/2014  . Smokeless tobacco: Former Neurosurgeon    Quit date: 12/04/2015  . Alcohol use No     Comment: Recovered alcoholic  . Drug use: No  . Sexual  activity: Not Currently

## 2017-07-19 IMAGING — RF DG CHOLANGIOGRAM OPERATIVE
1 series · 5 of 5 positions shown · non-contrast
Comparison: Ultrasound November 23, 2014.

CLINICAL DATA: Gallstones.

EXAM:
INTRAOPERATIVE CHOLANGIOGRAM
TECHNIQUE: Cholangiographic images from the C-arm fluoroscopic device were
submitted for interpretation post-operatively. Please see the
procedural report for the amount of contrast and the fluoroscopy
time utilized.
FLUOROSCOPY TIME:  12 seconds.

[Series 1: run · 2 acquisitions, 5 frames shown]
[im 1/2]
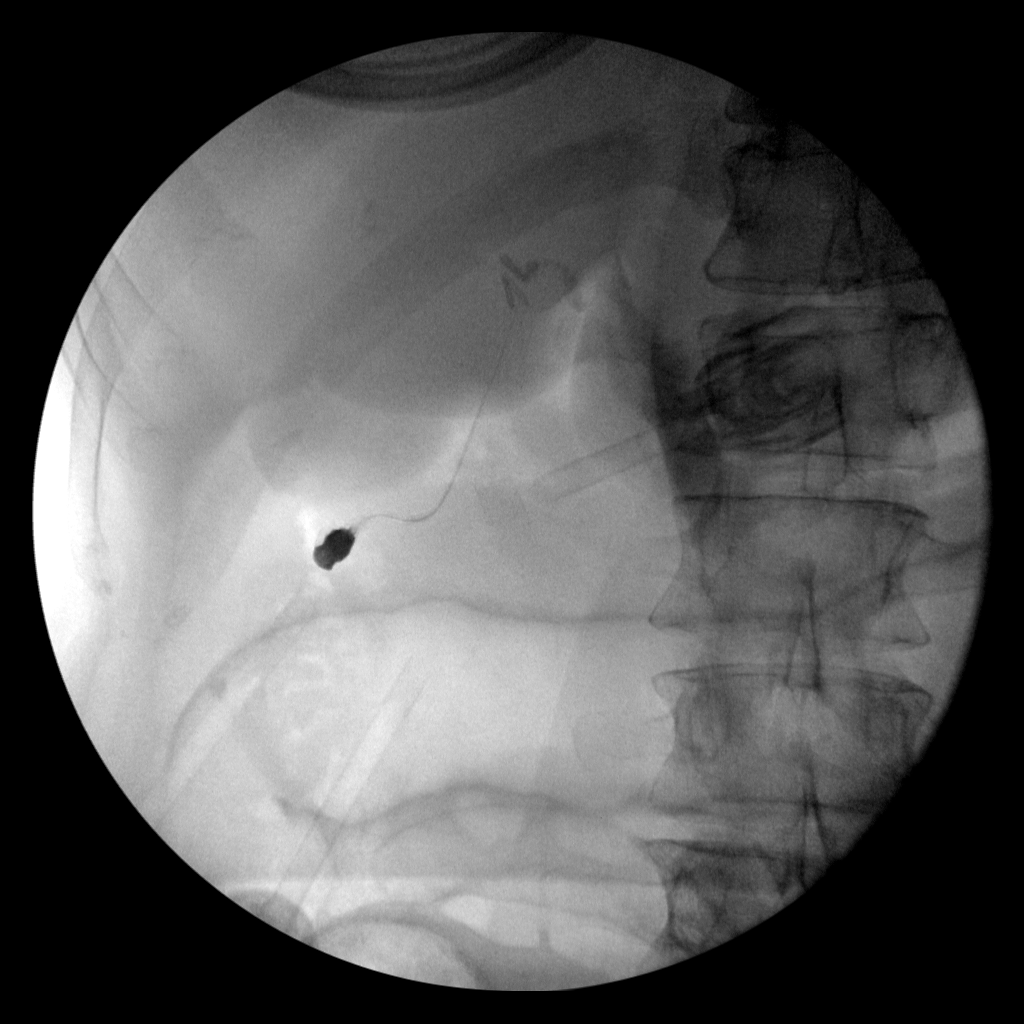
[im 1/2]
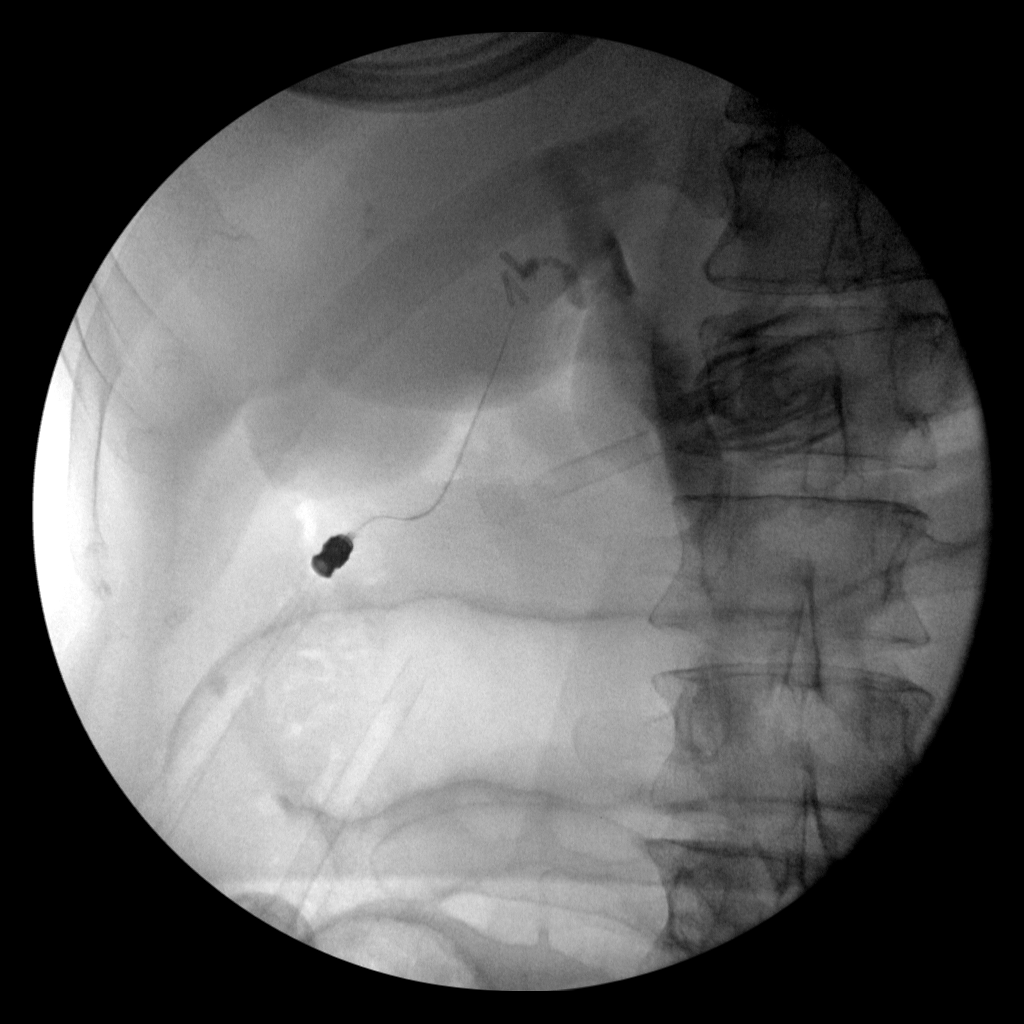
[im 1/2]
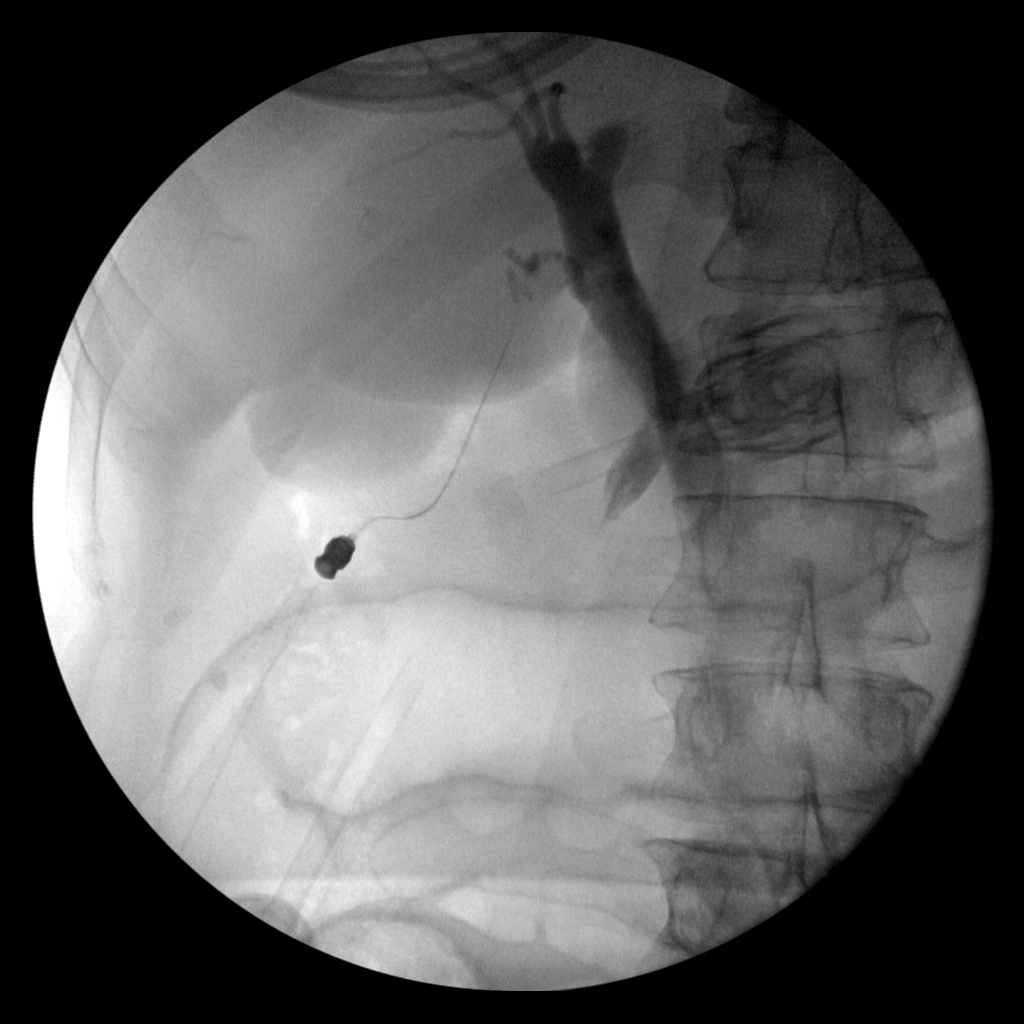
[im 1/2]
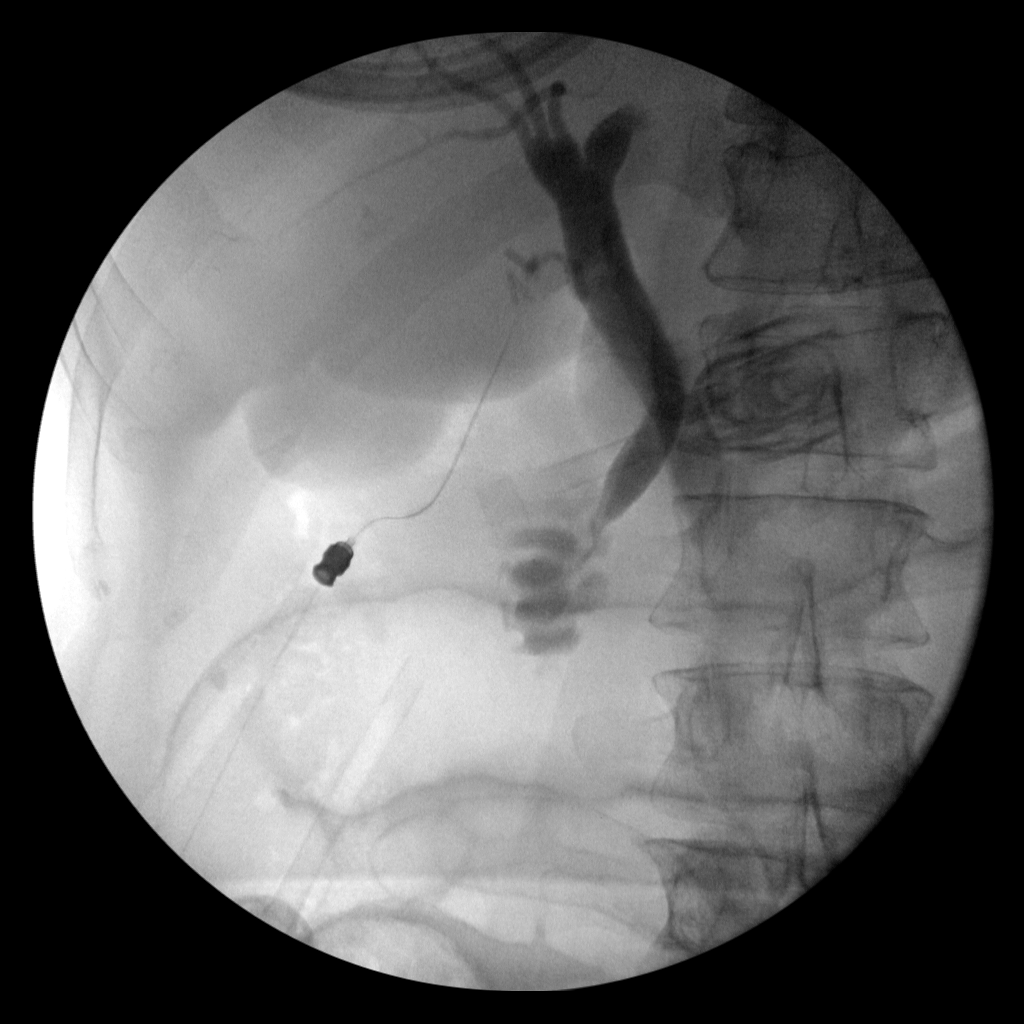
[im 2/2]
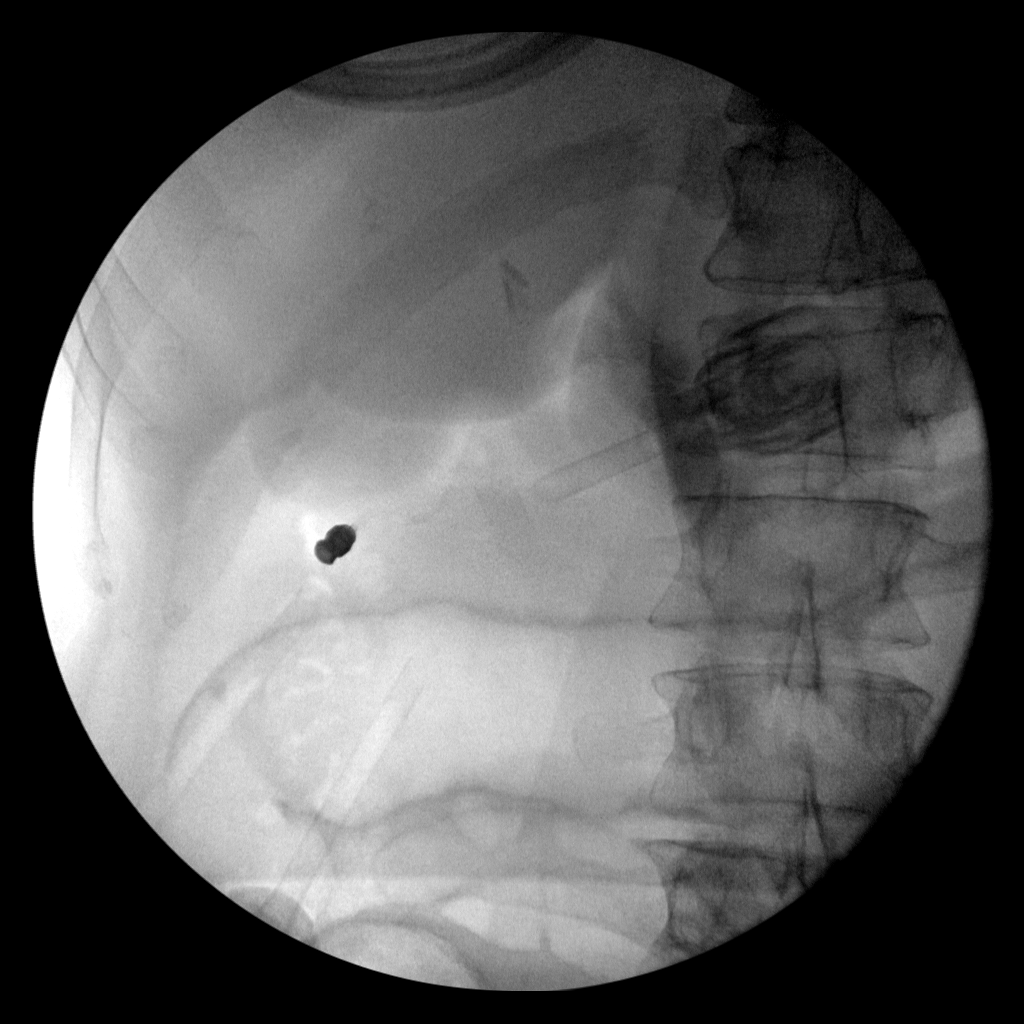

[5 of 5 positions shown; findings below may reference images not displayed]

FINDINGS: Two cine sequences were submitted for review. These demonstrate
contrast being injected into cannulated cystic duct remnant. Common
bile duct is dilated, but no filling defects are noted. Antegrade
flow into the duodenum is noted.
IMPRESSION: No filling defects are seen in common bile duct suggest retained
stones.

## 2018-08-03 DEATH — deceased

## 2018-12-07 ENCOUNTER — Encounter: Payer: Self-pay | Admitting: Family

## 2018-12-07 ENCOUNTER — Ambulatory Visit (INDEPENDENT_AMBULATORY_CARE_PROVIDER_SITE_OTHER): Payer: BLUE CROSS/BLUE SHIELD | Admitting: Family

## 2018-12-07 ENCOUNTER — Other Ambulatory Visit: Payer: Self-pay

## 2018-12-07 ENCOUNTER — Ambulatory Visit: Payer: Self-pay

## 2018-12-07 VITALS — Ht 72.0 in | Wt 198.0 lb

## 2018-12-07 DIAGNOSIS — M25571 Pain in right ankle and joints of right foot: Secondary | ICD-10-CM

## 2018-12-14 NOTE — Progress Notes (Signed)
Office Visit Note   Patient: Brendan [name]           Date of Birth: 16-Aug-1960           MRN: [id number] Visit Date: 12/07/2018              Requested by: Roddie Mc, FNP [address] [place],  [place] [address] PCP: Roddie Mc, FNP  Chief Complaint  Patient presents with  . Right Ankle - Pain      HPI: Patient is a 58 year old gentleman who presents today complaining of right ankle pain.  He did have a proximal tib-fib fracture April of this year with open reduction internal fixation.  He was followed with [facility] orthopedic surgery for this injury.  He has since attempted to return to work he states he is working at the [employer] where he must be standing and walking nonstop for 10-hour shifts on cement floors.  He has been having issues with foot and ankle pain since return to work after a shift or with prolonged standing and walking he begins to have some swelling and pain in his lower leg he has tried adapting his shoewear is currently using new balance walking shoes having some medial ankle pain.  He has been using ibuprofen 800 mg as needed this works well for his pain  Wonders if there is any physical therapy that he could do or any medication he could take to help with the swelling and aching pain after prolonged standing.  Assessment & Plan: Visit Diagnoses:  1. Pain in right ankle and joints of right foot     Plan: Radiographs reassuring.  The fractures are well-healed.  He seems to be having aching pain at the fracture site with prolonged weightbearing.  Discussed conservative measures patient declined referral to physical therapy feels he is already had adequate physical therapy he will continue with supportive shoe wear will consider taking more frequent breaks  Follow-Up Instructions: Return in about 4 weeks (around 01/04/2019), or if symptoms worsen or fail to improve.   Right Ankle Exam  Right ankle exam is normal.  Range  of Motion  The patient has normal right ankle ROM.  Tests  Anterior drawer: negative Varus tilt: negative  Other  Erythema: absent   Comments:  Ankle exam normal however he does have some bony deformity from callus formation to the distal tibia very mild surrounding soft tissue swelling.  No erythema no warmth no tenderness      Patient is alert, oriented, no adenopathy, well-dressed, normal affect, normal respiratory effort.   Imaging: No results found. No images are attached to the encounter.  Labs: Lab Results  Component Value Date   REPTSTATUS 11/29/2014 FINAL 11/24/2014   GRAMSTAIN  09/11/2010    MODERATE WBC PRESENT, PREDOMINANTLY PMN NO SQUAMOUS EPITHELIAL CELLS SEEN NO ORGANISMS SEEN   CULT  11/24/2014    NO GROWTH 5 DAYS Performed at [facility]    [facility] HYDROPHILA [facility] 09/11/2010     Lab Results  Component Value Date   ALBUMIN 3.1 (L) 11/26/2014   ALBUMIN 3.1 (L) 11/25/2014   ALBUMIN 3.7 11/24/2014    Lab Results  Component Value Date   MG 2.0 11/26/2014   MG 1.9 11/25/2014   MG 1.9 11/24/2014   No results found for: VD25OH  No results found for: PREALBUMIN CBC EXTENDED Latest Ref Rng & Units 11/26/2014 11/25/2014 11/24/2014  WBC 4.0 - 10.5 K/uL 5.8 8.5 13.5(H)  RBC 4.22 - 5.81 MIL/uL 3.74(L) 3.99(L) 4.56  HGB 13.0 - 17.0 g/dL 12.6(L) 13.2 14.9  HCT 39.0 - 52.0 % 34.9(L) 37.7(L) 42.8  PLT 150 - 400 K/uL 189 152 262  NEUTROABS 1.7 - 7.7 K/uL - 5.8 -  LYMPHSABS 0.7 - 4.0 K/uL - 1.3 -     Body mass index is 26.85 kg/m.  Orders:  Orders Placed This Encounter  Procedures  . XR Ankle 2 Views Right   No orders of the defined types were placed in this encounter.    Procedures: No procedures performed  Clinical Data: No additional findings.  ROS:  All other systems negative, except as noted in the HPI. Review of Systems  Constitutional: Negative for chills and fever.  Musculoskeletal: Positive for myalgias.  Negative for arthralgias and joint swelling.    Objective: Vital Signs: Ht 6' (1.829 m)   Wt 198 lb (89.8 kg)   BMI 26.85 kg/m   Specialty Comments:  No specialty comments available.  PMFS History: Patient Active Problem List   Diagnosis Date Noted  . Bipolar disorder, unspecified ([place]) 04/27/2013   Past Medical History:  Diagnosis Date  . Bipolar 1 disorder ([place])   . Bipolar disorder ([place])   . Bipolar disorder (manic depression) ([place]) 1990    Family History  Problem Relation Age of Onset  . Hypertension Mother   . Pancreatitis Mother   . Colon polyps Father   . Stroke Father   . Hypertension Other   . CAD Other   . Dementia Other     Past Surgical History:  Procedure Laterality Date  . CHOLECYSTECTOMY N/A 11/24/2014   Procedure: LAPAROSCOPIC CHOLECYSTECTOMY WITH INTRAOPERATIVE CHOLANGIOGRAM;  Surgeon: Autumn Messing III, MD;  Location: [facility];  Service: General;  Laterality: N/A;  . FEMUR SURGERY    . TONSILLECTOMY     Social History   Occupational History  . Not on file  Tobacco Use  . Smoking status: Former Smoker    Types: Cigarettes    Quit date: 02/02/2014    Years since quitting: 4.8  . Smokeless tobacco: Former Systems developer    Quit date: 12/04/2015  Substance and Sexual Activity  . Alcohol use: No    Comment: Recovered alcoholic  . Drug use: No  . Sexual activity: Not Currently

## 2022-06-08 ENCOUNTER — Other Ambulatory Visit: Payer: Self-pay

## 2022-06-08 DIAGNOSIS — M810 Age-related osteoporosis without current pathological fracture: Secondary | ICD-10-CM
